# Patient Record
Sex: Female | Born: 1995 | Race: Black or African American | Hispanic: No | Marital: Single
Health system: Southern US, Community
[De-identification: ages and names within clinical notes are randomized; demographics above are authoritative.]

## PROBLEM LIST (undated history)

## (undated) DIAGNOSIS — R87629 Unspecified abnormal cytological findings in specimens from vagina: Secondary | ICD-10-CM

## (undated) DIAGNOSIS — Z789 Other specified health status: Secondary | ICD-10-CM

## (undated) HISTORY — DX: Unspecified abnormal cytological findings in specimens from vagina: R87.629

## (undated) HISTORY — PX: TONSILLECTOMY: SUR1361

---
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
## 2015-02-24 ENCOUNTER — Ambulatory Visit (INDEPENDENT_AMBULATORY_CARE_PROVIDER_SITE_OTHER): Payer: Medicaid Other | Admitting: Pediatrics

## 2015-02-24 ENCOUNTER — Encounter: Payer: Self-pay | Admitting: Pediatrics

## 2015-02-24 VITALS — BP 110/70 | Ht 70.75 in | Wt 146.4 lb

## 2015-02-24 DIAGNOSIS — Z113 Encounter for screening for infections with a predominantly sexual mode of transmission: Secondary | ICD-10-CM

## 2015-02-24 DIAGNOSIS — Z0000 Encounter for general adult medical examination without abnormal findings: Secondary | ICD-10-CM | POA: Diagnosis not present

## 2015-02-24 DIAGNOSIS — Z68.41 Body mass index (BMI) pediatric, 5th percentile to less than 85th percentile for age: Secondary | ICD-10-CM

## 2015-02-24 LAB — CBC WITH DIFFERENTIAL/PLATELET
BASOS ABS: 0 10*3/uL (ref 0.0–0.1)
BASOS PCT: 1 % (ref 0–1)
Eosinophils Absolute: 0 10*3/uL (ref 0.0–0.7)
Eosinophils Relative: 1 % (ref 0–5)
HEMATOCRIT: 39.4 % (ref 36.0–46.0)
HEMOGLOBIN: 12.5 g/dL (ref 12.0–15.0)
LYMPHS PCT: 39 % (ref 12–46)
Lymphs Abs: 1.4 10*3/uL (ref 0.7–4.0)
MCH: 28 pg (ref 26.0–34.0)
MCHC: 31.7 g/dL (ref 30.0–36.0)
MCV: 88.3 fL (ref 78.0–100.0)
MPV: 11.3 fL (ref 8.6–12.4)
Monocytes Absolute: 0.2 10*3/uL (ref 0.1–1.0)
Monocytes Relative: 5 % (ref 3–12)
NEUTROS ABS: 1.9 10*3/uL (ref 1.7–7.7)
Neutrophils Relative %: 54 % (ref 43–77)
Platelets: 206 10*3/uL (ref 150–400)
RBC: 4.46 MIL/uL (ref 3.87–5.11)
RDW: 14.2 % (ref 11.5–15.5)
WBC: 3.6 10*3/uL — AB (ref 4.0–10.5)

## 2015-02-24 NOTE — Patient Instructions (Signed)
Exercising to Stay Healthy Exercising regularly is important. It has many health benefits, such as:  Improving your overall fitness, flexibility, and endurance.  Increasing your bone density.  Helping with weight control.  Decreasing your body fat.  Increasing your muscle strength.  Reducing stress and tension.  Improving your overall health. In order to become healthy and stay healthy, it is recommended that you do moderate-intensity and vigorous-intensity exercise. You can tell that you are exercising at a moderate intensity if you have a higher heart rate and faster breathing, but you are still able to hold a conversation. You can tell that you are exercising at a vigorous intensity if you are breathing much harder and faster and cannot hold a conversation while exercising. HOW OFTEN SHOULD I EXERCISE? Choose an activity that you enjoy and set realistic goals. Your health care provider can help you to make an activity plan that works for you. Exercise regularly as directed by your health care provider. This may include:   Doing resistance training twice each week, such as:  Push-ups.  Sit-ups.  Lifting weights.  Using resistance bands.  Doing a given intensity of exercise for a given amount of time. Choose from these options:  150 minutes of moderate-intensity exercise every week.  75 minutes of vigorous-intensity exercise every week.  A mix of moderate-intensity and vigorous-intensity exercise every week. Children, pregnant women, people who are out of shape, people who are overweight, and older adults may need to consult a health care provider for individual recommendations. If you have any sort of medical condition, be sure to consult your health care provider before starting a new exercise program.  WHAT ARE SOME EXERCISE IDEAS? Some moderate-intensity exercise ideas include:   Walking at a rate of 1 mile in 15  minutes.  Biking.  Hiking.  Golfing.  Dancing. Some vigorous-intensity exercise ideas include:   Walking at a rate of at least 4.5 miles per hour.  Jogging or running at a rate of 5 miles per hour.  Biking at a rate of at least 10 miles per hour.  Lap swimming.  Roller-skating or in-line skating.  Cross-country skiing.  Vigorous competitive sports, such as football, basketball, and soccer.  Jumping rope.  Aerobic dancing. WHAT ARE SOME EVERYDAY ACTIVITIES THAT CAN HELP ME TO GET EXERCISE?  Yard work, such as:  Pushing a lawn mower.  Raking and bagging leaves.  Washing and waxing your car.  Pushing a stroller.  Shoveling snow.  Gardening.  Washing windows or floors. HOW CAN I BE MORE ACTIVE IN MY DAY-TO-DAY ACTIVITIES?  Use the stairs instead of the elevator.  Take a walk during your lunch break.  If you drive, park your car farther away from work or school.  If you take public transportation, get off one stop early and walk the rest of the way.  Make all of your phone calls while standing up and walking around.  Get up, stretch, and walk around every 30 minutes throughout the day. WHAT GUIDELINES SHOULD I FOLLOW WHILE EXERCISING?  Do not exercise so much that you hurt yourself, feel dizzy, or get very short of breath.  Consult your health care provider before starting a new exercise program.  Wear comfortable clothes and shoes with good support.  Drink plenty of water while you exercise to prevent dehydration or heat stroke. Body water is lost during exercise and must be replaced.  Work out until you breathe faster and your heart beats faster.   This information   is not intended to replace advice given to you by your health care provider. Make sure you discuss any questions you have with your health care provider.   Document Released: 01/27/2010 Document Revised: 01/15/2014 Document Reviewed: 05/28/2013 Elsevier Interactive Patient Education  2016 Elsevier Inc.  

## 2015-02-24 NOTE — Progress Notes (Signed)
Adolescent Well Care Visit Gabriela Jones is a 20 y.o. female who is here for well care.    PCP:  Maree Erie, MD   History was provided by the patient and mother.  Current Issues: Current concerns include she is doing well.   Nutrition: Nutrition/Eating Behaviors: eats a variety of healthful foods Adequate calcium in diet?: yes Supplements/ Vitamins: no  Exercise/ Media: Play any Sports?/ Exercise: sometimes goes to the mall and walks around; no other identified exercise Screen Time:  > 2 hours-counseling provided Media Rules or Monitoring?: no - adult patient  Sleep:  Sleep: sleeps   Social Screening: Lives with:  Her mother Parental relations:  good Activities, Work, and Regulatory affairs officer?: helps with some things around the house Concerns regarding behavior with peers?  Not at the present time. 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 Stressors of note: no  Education: School Name: She has graduated high school and is currently not in any academic or work-related programs. States she might go to [employer] and study early childhood development.  Menstruation:   Patient's last menstrual period was 02/24/2015. Menstrual History: menses are variable due to her hormonal implant for contraceptive management   Confidentiality was discussed with the patient and, if applicable, with caregiver as well. Patient's personal or confidential phone number: not provided  Tobacco?  no Secondhand smoke exposure?  no Drugs/ETOH?  no  Sexually Active?  yes - has a similar aged boyfriend  Pregnancy Prevention: nexplanon  Safe at home, in school & in relationships?  Yes Safe to self?  Yes   Screenings: Patient has a dental home: yes - Dr. William Dalton  The patient completed the Rapid Assessment for Adolescent Preventive Services screening questionnaire and the following topics were  identified as risk factors and discussed: healthy eating and exercise  In addition, the following topics were discussed as part of anticipatory guidance drug use, condom use, social isolation, screen time and personal independance.  PHQ-9 completed and results indicated no problems - score of ZERO  Physical Exam:  Filed Vitals:   02/24/15 1357  BP: 110/70  Height: 5' 10.75" (1.797 m)  Weight: 146 lb 6.4 oz (66.407 kg)   BP 110/70 mmHg  Ht 5' 10.75" (1.797 m)  Wt 146 lb 6.4 oz (66.407 kg)  BMI 20.56 kg/m2  LMP 02/24/2015 Body mass index: body mass index is 20.56 kg/(m^2). Blood pressure percentiles are 39% systolic and 64% diastolic based on 2000 NHANES data. Blood pressure percentile targets: 90: 126/80, 95: 130/84, 99 + 5 mmHg: 142/97.   Visual Acuity Screening   Right eye Left eye Both eyes  Without correction:  With correction:       General Appearance:   alert, oriented, no acute distress  HENT: Normocephalic, no obvious abnormality, conjunctiva clear  Mouth:   Normal appearing teeth, no obvious discoloration, dental caries, or dental caps  Neck:   Supple; thyroid: no enlargement, symmetric, no tenderness/mass/nodules  Chest Breast if female: 5  Lungs:   Clear to auscultation bilaterally, normal work of breathing  Heart:   Regular rate and rhythm, S1 and S2 normal, no murmurs;   Abdomen:   Soft, non-tender, no mass, or organomegaly  GU normal female external genitalia, pelvic not performed, normal breast exam without suspicious masses, self exam taught, Tanner stage 4  Musculoskeletal:   Tone and strength strong and symmetrical, all  extremities               Lymphatic:   No cervical adenopathy  Skin/Hair/Nails:   Skin warm, dry and intact, no rashes, no bruises or petechiae  Neurologic:   Strength, gait, and coordination normal and age-appropriate     Assessment and Plan:   1. Encounter for general adult medical examination without abnormal findings    2. Routine screening for STI (sexually transmitted infection)   3. Body mass index, pediatric, 5th percentile to less than 85th percentile for age     BMI is appropriate for age  Hearing screening result:normal Vision screening result: normal  Orders Placed This Encounter  Procedures  . GC/Chlamydia Probe Amp  . Lipid panel  . HIV antibody  . CBC with Differential   Encouraged Jo-Ann to consider volunteer work with a summer kids program or [employer] to learn more about working with children and see if this is suited to her. Advised she join in with home activities to increase independent living skills.   Return for annual wellness visit and prn acute care.  Maree Erie, MD

## 2015-02-25 ENCOUNTER — Telehealth: Payer: Self-pay | Admitting: Pediatrics

## 2015-02-25 LAB — LIPID PANEL
Cholesterol: 177 mg/dL — ABNORMAL HIGH (ref 125–170)
HDL: 45 mg/dL (ref 36–76)
LDL Cholesterol: 123 mg/dL — ABNORMAL HIGH (ref <110)
TRIGLYCERIDES: 47 mg/dL (ref 40–136)
Total CHOL/HDL Ratio: 3.9 Ratio (ref <=5.0)
VLDL: 9 mg/dL (ref <30)

## 2015-02-25 LAB — GC/CHLAMYDIA PROBE AMP
CT PROBE, AMP APTIMA: NOT DETECTED
GC Probe RNA: NOT DETECTED

## 2015-02-25 LAB — HIV ANTIBODY (ROUTINE TESTING W REFLEX): HIV: NONREACTIVE

## 2015-02-25 NOTE — Telephone Encounter (Signed)
Called and reached mother (Amos's preferred contact). Informed her that cholesterol is up a little and encouraged they avoid excessive intake of fatty and fried foods, increase exercise. Mom voiced understanding and stated she will "get her under control", speaking in a jovial and appropriate manner. Advised mom we will recheck next year and she can call as needed.

## 2015-04-30 ENCOUNTER — Other Ambulatory Visit: Payer: Self-pay | Admitting: Pediatrics

## 2015-05-25 ENCOUNTER — Telehealth: Payer: Self-pay | Admitting: *Deleted

## 2015-05-25 NOTE — Telephone Encounter (Signed)
Called back at listed cell number and reached voice mail; left message that I called and she can call back.

## 2015-05-25 NOTE — Telephone Encounter (Signed)
Caller requested a call back from Dr. Duffy RhodyStanley. Left no further information.

## 2015-05-26 NOTE — Telephone Encounter (Signed)
Pt called back this morning stating that she missed a call from Dr. Dorothyann Peng. Pt stated that she needs tot alk with dr. Dorothyann Peng and asked to be called at [phone]

## 2015-05-27 NOTE — Telephone Encounter (Signed)
Reached voice mail, again, and left message. Left message stating my typical time to call back is after finishing patients in the evening; however, is she has an emergency question any medical staff member here may be able to help. Stated I am off for the weekend and will try back on Monday.

## 2015-06-02 ENCOUNTER — Telehealth: Payer: Self-pay | Admitting: *Deleted

## 2015-06-02 NOTE — Telephone Encounter (Signed)
Called and left a voicemail asking patient to return call and ask for me so I can assist her or give Dr. Duffy RhodyStanley information so we can help her.

## 2015-06-15 ENCOUNTER — Ambulatory Visit (INDEPENDENT_AMBULATORY_CARE_PROVIDER_SITE_OTHER): Payer: Medicaid Other | Admitting: Pediatrics

## 2015-06-15 ENCOUNTER — Encounter: Payer: Self-pay | Admitting: Pediatrics

## 2015-06-15 VITALS — Wt 145.2 lb

## 2015-06-15 DIAGNOSIS — M62830 Muscle spasm of back: Secondary | ICD-10-CM

## 2015-06-15 MED ORDER — NAPROXEN 375 MG PO TABS: ORAL_TABLET | ORAL | Status: DC

## 2015-06-15 MED ORDER — CYCLOBENZAPRINE HCL 5 MG PO TABS: ORAL_TABLET | ORAL | Status: DC

## 2015-06-15 NOTE — Patient Instructions (Signed)
Avoid wearing flip flops, flat sandals or ballet style shoes for any distance walking; they do not supply adequate support and may add to back strain.   Back Pain, Adult Back pain is very common. The pain often gets better over time. The cause of back pain is usually not dangerous. Most people can learn to manage their back pain on their own.  HOME CARE  Watch your back pain for any changes. The following actions may help to lessen any pain you are feeling:  Stay active. Start with short walks on flat ground if you can. Try to walk farther each day.  Exercise regularly as told by your doctor. Exercise helps your back heal faster. It also helps avoid future injury by keeping your muscles strong and flexible.  Do not sit, drive, or stand in one place for more than 30 minutes.  Do not stay in bed. Resting more than 1-2 days can slow down your recovery.  Be careful when you bend or lift an object. Use good form when lifting:  Bend at your knees.  Keep the object close to your body.  Do not twist.  Sleep on a firm mattress. Lie on your side, and bend your knees. If you lie on your back, put a pillow under your knees.  Take medicines only as told by your doctor.  Put ice on the injured area.  Put ice in a plastic bag.  Place a towel between your skin and the bag.  Leave the ice on for 20 minutes, 2-3 times a day for the first 2-3 days. After that, you can switch between ice and heat packs.  Avoid feeling anxious or stressed. Find good ways to deal with stress, such as exercise.  Maintain a healthy weight. Extra weight puts stress on your back. GET HELP IF:   You have pain that does not go away with rest or medicine.  You have worsening pain that goes down into your legs or buttocks.  You have pain that does not get better in one week.  You have pain at night.  You lose weight.  You have a fever or chills. GET HELP RIGHT AWAY IF:   You cannot control when you poop  (bowel movement) or pee (urinate).  Your arms or legs feel weak.  Your arms or legs lose feeling (numbness).  You feel sick to your stomach (nauseous) or throw up (vomit).  You have belly (abdominal) pain.  You feel like you may pass out (faint).   This information is not intended to replace advice given to you by your health care provider. Make sure you discuss any questions you have with your health care provider.   Document Released: 06/13/2007 Document Revised: 01/15/2014 Document Reviewed: 04/28/2013 Elsevier Interactive Patient Education 2016 Elsevier

## 2015-06-15 NOTE — Progress Notes (Signed)
Subjective:     Patient ID: Gabriela Jones, female   DOB: 09/27/1995, 20 y.o.   MRN: [id number]  HPI Gabriela Jones is here with concerns of recurrent back pain for 1 week. She is accompanied by her mother who remains in the waiting room. [name] states this is like the pain she had before. States she wasn't doing anything special but developed the lower back pain; trigger is not known. Taking ibuprofen or tylenol without help. No fever or abdominal pain. She has been walking for fun and exercise. Not working or driving.  PMH, problem list, medications and allergies, family and social history reviewed and updated as indicated. Chart review reveals presentation to ED 8 months ago with muscle spasm of the back treated with Flexeril and ibuprofen.  Review of Systems  Constitutional: Positive for activity change. Negative for fever and appetite change.  HENT: Negative for congestion.   Respiratory: Negative for cough.   Gastrointestinal: Negative for abdominal distention.  Genitourinary: Negative for difficulty urinating.  Musculoskeletal: Positive for myalgias and back pain. Negative for neck pain and neck stiffness.       Objective:   Physical Exam  Constitutional: She appears well-developed and well-nourished. No distress.  She is wearing basic flat flip-flops  Cardiovascular: Normal rate and normal heart sounds.   No murmur heard. Pulmonary/Chest: Effort normal and breath sounds normal. No respiratory distress.  Abdominal: Soft. She exhibits no distension. There is no tenderness.  Musculoskeletal:  Standing: Patient's posture has obvious tilt to the right with right shoulder lower than left. Muscle prominence in left lumbar area with pain on percussion left more than right. Supine: pain on flexion of thigh at hip bilaterally but more discomfort voiced on the left  Skin: Skin is warm and dry.  Nursing note and vitals reviewed.      Assessment:     1. Muscle spasm of back    Unsure of  trigger but may be related to activity in poorly supportive shoes    Plan:     Meds ordered this encounter  Medications  . naproxen (NAPROSYN) 375 MG tablet    Sig: Take one tablet by mouth every 12 hours as needed, with food, for relief of back pain    Dispense:  20 tablet    Refill:  0  . cyclobenzaprine (FLEXERIL) 5 MG tablet    Sig: Take one tablet by mouth at bedtime for relief of muscle spasm    Dispense:  3 tablet    Refill:  0  Counseled on the Flexeril with information about probable sedation. Advised to stay home where mom can observe her reaction to the medication and avoid going out with friends or others who may place her at risk. No alcohol. Discussed use of naproxen.  Rest.  Proper shoes for activities discussed. Recheck in office next week and prn.  Will consider PT if not improving. Both patient and mother voiced understanding and ability to follow through.  Greater than 50% of this 25 minute face to face encounter spent in counseling for presenting issues.  Maree ErieStanley, Chiana Wamser J, MD

## 2015-06-17 ENCOUNTER — Ambulatory Visit: Payer: Self-pay | Admitting: Pediatrics

## 2015-06-23 ENCOUNTER — Encounter: Payer: Self-pay | Admitting: Pediatrics

## 2015-06-23 ENCOUNTER — Ambulatory Visit (INDEPENDENT_AMBULATORY_CARE_PROVIDER_SITE_OTHER): Payer: Medicaid Other | Admitting: Pediatrics

## 2015-06-23 VITALS — BP 108/50 | Ht 70.5 in | Wt 158.6 lb

## 2015-06-23 DIAGNOSIS — M62830 Muscle spasm of back: Secondary | ICD-10-CM

## 2015-06-23 NOTE — Patient Instructions (Addendum)
Continue the Naproxen for pain relief as needed. Avoid wearing flip flops. Start the stretches noted below, gradually. Once the pain is gone, get into a daily exercise plan like walking (wear supportive sneakers), swimming, biking, dance, yoga. Yoga is especially good for some one who has recurring back pain because of the gently stretch to the muscles.  Back Exercises If you have pain in your back, do these exercises 2-3 times each day or as told by your doctor. When the pain goes away, do the exercises once each day, but repeat the steps more times for each exercise (do more repetitions). If you do not have pain in your back, do these exercises once each day or as told by your doctor. EXERCISES Single Knee to Chest Do these steps 3-5 times in a row for each leg: 1. Lie on your back on a firm bed or the floor with your legs stretched out. 2. Bring one knee to your chest. 3. Hold your knee to your chest by grabbing your knee or thigh. 4. Pull on your knee until you feel a gentle stretch in your lower back. 5. Keep doing the stretch for 10-30 seconds. 6. Slowly let go of your leg and straighten it. Pelvic Tilt Do these steps 5-10 times in a row: 1. Lie on your back on a firm bed or the floor with your legs stretched out. 2. Bend your knees so they point up to the ceiling. Your feet should be flat on the floor. 3. Tighten your lower belly (abdomen) muscles to press your lower back against the floor. This will make your tailbone point up to the ceiling instead of pointing down to your feet or the floor. 4. Stay in this position for 5-10 seconds while you gently tighten your muscles and breathe evenly. Cat-Cow Do these steps until your lower back bends more easily: 1. Get on your hands and knees on a firm surface. Keep your hands under your shoulders, and keep your knees under your hips. You may put padding under your knees. 2. Let your head hang down, and make your tailbone point down to the  floor so your lower back is round like the back of a cat. 3. Stay in this position for 5 seconds. 4. Slowly lift your head and make your tailbone point up to the ceiling so your back hangs low (sags) like the back of a cow. 5. Stay in this position for 5 seconds. Press-Ups Do these steps 5-10 times in a row: 1. Lie on your belly (face-down) on the floor. 2. Place your hands near your head, about shoulder-width apart. 3. While you keep your back relaxed and keep your hips on the floor, slowly straighten your arms to raise the top half of your body and lift your shoulders. Do not use your back muscles. To make yourself more comfortable, you may change where you place your hands. 4. Stay in this position for 5 seconds. 5. Slowly return to lying flat on the floor. Bridges Do these steps 10 times in a row: 1. Lie on your back on a firm surface. 2. Bend your knees so they point up to the ceiling. Your feet should be flat on the floor. 3. Tighten your butt muscles and lift your butt off of the floor until your waist is almost as high as your knees. If you do not feel the muscles working in your butt and the back of your thighs, slide your feet 1-2 inches farther away from  your butt. 4. Stay in this position for 3-5 seconds. 5. Slowly lower your butt to the floor, and let your butt muscles relax. If this exercise is too easy, try doing it with your arms crossed over your chest. Belly Crunches Do these steps 5-10 times in a row: 1. Lie on your back on a firm bed or the floor with your legs stretched out. 2. Bend your knees so they point up to the ceiling. Your feet should be flat on the floor. 3. Cross your arms over your chest. 4. Tip your chin a little bit toward your chest but do not bend your neck. 5. Tighten your belly muscles and slowly raise your chest just enough to lift your shoulder blades a tiny bit off of the floor. 6. Slowly lower your chest and your head to the floor. Back Lifts Do  these steps 5-10 times in a row: 1. Lie on your belly (face-down) with your arms at your sides, and rest your forehead on the floor. 2. Tighten the muscles in your legs and your butt. 3. Slowly lift your chest off of the floor while you keep your hips on the floor. Keep the back of your head in line with the curve in your back. Look at the floor while you do this. 4. Stay in this position for 3-5 seconds. 5. Slowly lower your chest and your face to the floor. GET HELP IF:  Your back pain gets a lot worse when you do an exercise.  Your back pain does not lessen 2 hours after you exercise. If you have any of these problems, stop doing the exercises. Do not do them again unless your doctor says it is okay. GET HELP RIGHT AWAY IF:  You have sudden, very bad back pain. If this happens, stop doing the exercises. Do not do them again unless your doctor says it is okay.   This information is not intended to replace advice given to you by your health care provider. Make sure you discuss any questions you have with your health care provider.   Document Released: 01/27/2010 Document Revised: 09/15/2014 Document Reviewed: 02/18/2014 Elsevier Interactive Patient Education [employer]2016 Elsevier Inc.

## 2015-06-27 ENCOUNTER — Encounter: Payer: Self-pay | Admitting: Pediatrics

## 2015-06-27 NOTE — Progress Notes (Signed)
Subjective:     Patient ID: Gabriela Jones, female   DOB: 05/22/1995, 20 y.o.   MRN: [id number]  HPI Gabriela Jones is here today to follow up on back pain. She states her mother remains in the waiting room. Gabriela Jones was seen in the office one week ago and given a short course of muscle relaxant for use at bedtime and naproxen for pain relief. She states she is better but not totally back to normal. States she is not wearing flip-flops much and no excessive activity or lifting.  No new concerns. PMH, problem list, medications and allergies, family and social history reviewed and updated as indicated.  Review of Systems  Constitutional: Negative for fever, activity change, appetite change and fatigue.  Gastrointestinal: Negative for abdominal pain and constipation.  Musculoskeletal: Positive for back pain. Negative for joint swelling, arthralgias, neck pain and neck stiffness.  Psychiatric/Behavioral: Negative for sleep disturbance.       Objective:   Physical Exam  Constitutional: She is oriented to person, place, and time. She appears well-developed and well-nourished. No distress.  Wearing Zori sandal type flip-flops  Cardiovascular: Normal rate and normal heart sounds.   No murmur heard. Pulmonary/Chest: Effort normal and breath sounds normal.  Musculoskeletal:  Normal gait; full forward flexion of spine with minimal noted muscle asymmetry with spasm in the right lumbar area. No redness or pain on palpation/percussion  Neurological: She is alert and oriented to person, place, and time. Coordination normal.  Skin: Skin is warm and dry.  Nursing note and vitals reviewed.      Assessment:     1. Muscle spasm of back   Improved clinically and by report.     Plan:     Advised on no significant walking in flip-flops or non-supportive shoes. Encouraged exercise to help with muscle relaxation; provided documentation with pictures and showed basics of performance. Okay to continue naproxen prn;  call if increased symptoms or if not better in one more week.  Greater than 50% of this 15 minute face to face encounter spent in counseling for presenting issues.  Maree ErieStanley, Angela J, MD

## 2015-07-04 ENCOUNTER — Emergency Department (HOSPITAL_COMMUNITY)
Admission: EM | Admit: 2015-07-04 | Discharge: 2015-07-04 | Disposition: A | Discharge status: Home / Self Care | Payer: Medicaid Other | Attending: Emergency Medicine | Admitting: Emergency Medicine

## 2015-07-04 ENCOUNTER — Encounter (HOSPITAL_COMMUNITY): Payer: Self-pay | Admitting: Nurse Practitioner

## 2015-07-04 DIAGNOSIS — Z7722 Contact with and (suspected) exposure to environmental tobacco smoke (acute) (chronic): Secondary | ICD-10-CM | POA: Insufficient documentation

## 2015-07-04 DIAGNOSIS — K5900 Constipation, unspecified: Secondary | ICD-10-CM | POA: Diagnosis present

## 2015-07-04 MED ORDER — MAGNESIUM CITRATE PO SOLN
1.0000 | Freq: Once | ORAL | Status: AC
  Administered 2015-07-04: 1 via ORAL
  Filled 2015-07-04: qty 296

## 2015-07-04 NOTE — ED Notes (Addendum)
She c/o constipation since waking this morning. She reports feeling the need to have a BM all day but she can not push her stool out and it feels very hard. She did not try anything at home for relief. She denies any abd pain, n/v, urinary changes.

## 2015-07-04 NOTE — ED Provider Notes (Signed)
CSN: [id number]     Arrival date & time 07/04/15  1823 History  By signing my name below, I, Rosario AdieWilliam Andrew Hiatt, attest that this documentation has been prepared under the direction and in the presence of [facility] Center[facility]ime Ward, PA-C.   Electronically Signed: Rosario AdieWilliam Andrew Hiatt, ED Scribe. 07/04/2015. 11:07 PM.   Chief Complaint  Patient presents with  . Constipation   The history is provided by the patient. No language interpreter was used.   HPI Comments: Gabriela Jones is a 20 y.o. female who presents to the Emergency Department complaining of gradual onset, unchanged, constant constipation onset approximately 12 hours PTA. She states that she has felt the need to have a bowel movement since the onset of her symptoms. Her last normal bowel movement was one day PTA, and did not contain blood. She states that she did have a bowel movement just PTA but very little would come out. No OTC medications or home remedies tried PTA. Pt denies nausea, vomiting, abdominal pain, back pain, or fever.  History reviewed. No pertinent past medical history. Past Surgical History  Procedure Laterality Date  . Tonsillectomy     Family History  Problem Relation Age of Onset  . Hypertension Father    Social History  Substance Use Topics  . Smoking status: Passive Smoke Exposure - Never Smoker  . Smokeless tobacco: Never Used  . Alcohol Use: No   OB History    No data available     Review of Systems  Constitutional: Negative for fever.  Gastrointestinal: Positive for constipation. Negative for nausea, vomiting and abdominal pain.  Musculoskeletal: Negative for back pain.   Allergies  Review of patient's allergies indicates no known allergies.  Home Medications   Prior to Admission medications   Medication Sig Start Date End Date Taking? Authorizing Provider  cyclobenzaprine (FLEXERIL) 5 MG tablet Take one tablet by mouth at bedtime for relief of muscle spasm Patient not taking: Reported on 07/04/2015  06/15/15   Maree ErieAngela J Stanley, MD  DIFFERIN 0.1 % cream APPLY TOPICALLY AT BEDTIME TO AFFECTED AREAS OF ACNE AFTER CLEANSING FACE Patient not taking: Reported on 07/04/2015 04/30/15   Maree ErieAngela J Stanley, MD  naproxen (NAPROSYN) 375 MG tablet Take one tablet by mouth every 12 hours as needed, with food, for relief of back pain Patient not taking: Reported on 07/04/2015 06/15/15   Maree ErieAngela J Stanley, MD  polyethylene glycol powder (GLYCOLAX/MIRALAX) powder Mix one capful in 8 ounces of liquid and drink daily for relief of constipation Patient not taking: Reported on 07/04/2015 02/18/14   Maree ErieAngela J Stanley, MD   BP 95/58 mmHg  Pulse 86  Temp(Src) 98.9 F (37.2 C) (Oral)  Resp 17  SpO2 100%  LMP 05/23/2015 (Within Days)   Physical Exam  Constitutional: She is oriented to person, place, and time. She appears well-developed and well-nourished.  HENT:  Head: Normocephalic and atraumatic.  Cardiovascular: Normal rate, regular rhythm and normal heart sounds.   Pulmonary/Chest: Effort normal and breath sounds normal. No respiratory distress.  Abdominal: Soft. Bowel sounds are normal. She exhibits no distension and no mass. There is tenderness. There is no rebound and no guarding.  Mild left sided tenderness.  Musculoskeletal: Normal range of motion.  Neurological: She is alert and oriented to person, place, and time.  Skin: Skin is warm and dry.  Psychiatric: She has a normal mood and affect. Her behavior is normal.  Nursing note and vitals reviewed.  ED Course  Procedures (including critical care  time)  DIAGNOSTIC STUDIES: Oxygen Saturation is 100% on RA, normal by my interpretation.   COORDINATION OF CARE: 11:05 PM-Discussed next steps with pt including Magnesium Citrate. Pt verbalized understanding and is agreeable with the plan.   MDM   Final diagnoses:  Constipation, unspecified constipation type   Gabriela Jones presents to ED for constipation. Denies abdominal pain. Benign abdominal exam.  Otherwise healthy, well appearing 20 y.o. female who is afebrile and hemodynamically stable. Mag citrate given in ED. Home care instructions including hydration, diet, and miralax regimen discussed. PCP follow up recommended. Abdominal pain return precautions given. All questions answered.   I personally performed the services described in this documentation, which was scribed in my presence. The recorded information has been reviewed and is accurate.    [facility] CentersJaime Pilcher Ward, PA-C 07/05/15 [id number]  Melene Planan Floyd, DO 07/05/15 1513

## 2015-07-04 NOTE — Discharge Instructions (Signed)
One cap of Miralax mixed in a glass of water daily until bowel movements are regular. An over the counter stool softener such as Colace can also be used.  Follow up with your primary physician in regard's to today's visit. Return to ER for vomiting, new or worsening symptoms, any additional concerns.   GETTING TO GOOD BOWEL HEALTH.     The goal: ONE SOFT BOWEL MOVEMENT A DAY!  To have soft, regular bowel movements:   Drink at least 8 tall glasses of water a day.    Take plenty of fiber.  Fiber is the undigested part of plant food that passes into the colon, acting s natures broom to encourage bowel motility and movement.  Fiber can absorb and hold large amounts of water. This results in a larger, bulkier stool, which is soft and easier to pass. Work gradually over several weeks up to 6 servings a day of fiber (25g a day even more if needed) in the form of: o Vegetables -- Root (potatoes, carrots, turnips), leafy green (lettuce, salad greens, celery, spinach), or cooked high residue (cabbage, broccoli, etc) o Fruit -- Fresh (unpeeled skin & pulp), Dried (prunes, apricots, cherries, etc ),  or stewed ( applesauce)  o Whole grain breads, pasta, etc (whole wheat)  o Bran cereals   No reading or other relaxing activity while on the toilet. If bowel movements take longer than 5 minutes, you are too constipated

## 2015-07-04 NOTE — ED Notes (Signed)
PA at bedside.

## 2015-10-03 ENCOUNTER — Encounter (HOSPITAL_COMMUNITY): Payer: Self-pay | Admitting: Emergency Medicine

## 2015-10-03 ENCOUNTER — Emergency Department (HOSPITAL_COMMUNITY)
Admission: EM | Admit: 2015-10-03 | Discharge: 2015-10-03 | Disposition: A | Discharge status: Home / Self Care | Payer: Medicaid Other | Attending: Dermatology | Admitting: Dermatology

## 2015-10-03 ENCOUNTER — Other Ambulatory Visit: Payer: Self-pay | Admitting: Pediatrics

## 2015-10-03 DIAGNOSIS — Z7722 Contact with and (suspected) exposure to environmental tobacco smoke (acute) (chronic): Secondary | ICD-10-CM | POA: Diagnosis not present

## 2015-10-03 DIAGNOSIS — Z5321 Procedure and treatment not carried out due to patient leaving prior to being seen by health care provider: Secondary | ICD-10-CM | POA: Diagnosis not present

## 2015-10-03 DIAGNOSIS — Y939 Activity, unspecified: Secondary | ICD-10-CM | POA: Diagnosis not present

## 2015-10-03 DIAGNOSIS — S6991XA Unspecified injury of right wrist, hand and finger(s), initial encounter: Secondary | ICD-10-CM | POA: Insufficient documentation

## 2015-10-03 DIAGNOSIS — Y929 Unspecified place or not applicable: Secondary | ICD-10-CM | POA: Diagnosis not present

## 2015-10-03 DIAGNOSIS — K5901 Slow transit constipation: Secondary | ICD-10-CM

## 2015-10-03 DIAGNOSIS — X58XXXA Exposure to other specified factors, initial encounter: Secondary | ICD-10-CM | POA: Insufficient documentation

## 2015-10-03 DIAGNOSIS — Z79899 Other long term (current) drug therapy: Secondary | ICD-10-CM | POA: Insufficient documentation

## 2015-10-03 DIAGNOSIS — Y999 Unspecified external cause status: Secondary | ICD-10-CM | POA: Diagnosis not present

## 2015-10-03 NOTE — ED Triage Notes (Signed)
Rt index finger hurting x 3 days  Denies any injury

## 2015-10-03 NOTE — ED Notes (Signed)
NT called pt's name to be room x3. No answer.

## 2015-10-03 NOTE — ED Notes (Signed)
Patient called in sub waiting and main waiting room with no answer.

## 2016-02-06 ENCOUNTER — Other Ambulatory Visit: Payer: Self-pay | Admitting: Pediatrics

## 2016-02-06 DIAGNOSIS — K5901 Slow transit constipation: Secondary | ICD-10-CM

## 2016-02-27 ENCOUNTER — Ambulatory Visit: Payer: Medicaid Other | Admitting: Pediatrics

## 2016-03-30 ENCOUNTER — Encounter: Payer: Self-pay | Admitting: Pediatrics

## 2016-03-30 ENCOUNTER — Other Ambulatory Visit: Payer: Self-pay | Admitting: Pediatrics

## 2016-03-30 ENCOUNTER — Ambulatory Visit (INDEPENDENT_AMBULATORY_CARE_PROVIDER_SITE_OTHER): Payer: Medicaid Other | Admitting: Pediatrics

## 2016-03-30 VITALS — BP 112/70 | Ht 70.5 in | Wt 182.2 lb

## 2016-03-30 DIAGNOSIS — Z68.41 Body mass index (BMI) pediatric, 5th percentile to less than 85th percentile for age: Secondary | ICD-10-CM | POA: Diagnosis not present

## 2016-03-30 DIAGNOSIS — Z113 Encounter for screening for infections with a predominantly sexual mode of transmission: Secondary | ICD-10-CM

## 2016-03-30 DIAGNOSIS — Z0001 Encounter for general adult medical examination with abnormal findings: Secondary | ICD-10-CM | POA: Diagnosis not present

## 2016-03-30 DIAGNOSIS — R51 Headache: Secondary | ICD-10-CM | POA: Diagnosis not present

## 2016-03-30 DIAGNOSIS — R519 Headache, unspecified: Secondary | ICD-10-CM

## 2016-03-30 DIAGNOSIS — L700 Acne vulgaris: Secondary | ICD-10-CM | POA: Insufficient documentation

## 2016-03-30 LAB — CBC
HCT: 41.7 % (ref 35.0–45.0)
Hemoglobin: 13.4 g/dL (ref 11.7–15.5)
MCH: 28.3 pg (ref 27.0–33.0)
MCHC: 32.1 g/dL (ref 32.0–36.0)
MCV: 88 fL (ref 80.0–100.0)
MPV: 10.9 fL (ref 7.5–12.5)
Platelets: 212 10*3/uL (ref 140–400)
RBC: 4.74 MIL/uL (ref 3.80–5.10)
RDW: 13.4 % (ref 11.0–15.0)
WBC: 4.1 10*3/uL (ref 3.8–10.8)

## 2016-03-30 LAB — LIPID PANEL
Cholesterol: 183 mg/dL (ref <200)
HDL: 36 mg/dL — ABNORMAL LOW (ref >50)
LDL Cholesterol: 124 mg/dL — ABNORMAL HIGH (ref <100)
Total CHOL/HDL Ratio: 5.1 Ratio — ABNORMAL HIGH (ref <5.0)
Triglycerides: 114 mg/dL (ref <150)
VLDL: 23 mg/dL (ref <30)

## 2016-03-30 MED ORDER — DIFFERIN 0.1 % EX CREA: TOPICAL_CREAM | CUTANEOUS | 2 refills | Status: DC

## 2016-03-30 NOTE — Patient Instructions (Addendum)
Please connect with an adult provider of your choice and let us know if you need our clinic to provide records to the new clinic.   Analgesic Rebound Headache An analgesic rebound headache, sometimes called a medication overuse headache, is a headache that comes after pain medicine (analgesic) taken to treat the original (primary) headache has worn off. Any type of primary headache can return as a rebound headache if a person regularly takes analgesics more than three times a week to treat it. The types of primary headaches that are commonly associated with rebound headaches include:  Migraines.  Headaches that arise from tense muscles in the head and neck area (tension headaches).  Headaches that develop and happen again (recur) on one side of the head and around the eye (cluster headaches). If rebound headaches continue, they become chronic daily headaches. What are the causes? This condition may be caused by frequent use of:  Over-the-counter medicines such as aspirin, ibuprofen, and acetaminophen.  Sinus relief medicines and other medicines that contain caffeine.  Narcotic pain medicines such as codeine and oxycodone. What are the signs or symptoms? The symptoms of a rebound headache are the same as the symptoms of the original headache. Some of the symptoms of specific types of headaches include: Migraine headache   Pulsing or throbbing pain on one or both sides of the head.  Severe pain that interferes with daily activities.  Pain that is worsened by physical activity.  Nausea, vomiting, or both.  Pain with exposure to bright light, loud noises, or strong smells.  General sensitivity to bright light, loud noises, or strong smells.  Visual changes.  Numbness of one or both arms. Tension headache   Pressure around the head.  Dull, aching head pain.  Pain felt over the front and sides of the head.  Tenderness in the muscles of the head, neck, and shoulders. Cluster  headache   Severe pain that begins in or around one eye or temple.  Redness and tearing in the eye on the same side as the pain.  Droopy or swollen eyelid.  One-sided head pain.  Nausea.  Runny nose.  Sweaty, pale facial skin.  Restlessness. How is this diagnosed? This condition is diagnosed by:  Reviewing your medical history. This includes the nature of your primary headaches.  Reviewing the types of pain medicines that you have been using to treat your headaches and how often you take them. How is this treated? This condition may be treated or managed by:  Discontinuing frequent use of the analgesic medicine. Doing this may worsen your headaches at first, but the pain should eventually become more manageable, less frequent, and less severe.  Seeing a headache specialist. He or she may be able to help you manage your headaches and help make sure there is not another cause of the headaches.  Using methods of stress relief, such as acupuncture, counseling, biofeedback, and massage. Talk with your health care provider about which methods might be good for you. Follow these instructions at home:  Take over-the-counter and prescription medicines only as told by your health care provider.  Stop the repeated use of pain medicine as told by your health care provider. Stopping can be difficult. Carefully follow instructions from your health care provider.  Avoid triggers that are known to cause your primary headaches.  Keep all follow-up visits as told by your health care provider. This is important. Contact a health care provider if:  You continue to experience headaches after following  treatments that your health care provider recommended. Get help right away if:  You develop new headache pain.  You develop headache pain that is different than what you have experienced in the past.  You develop numbness or tingling in your arms or legs.  You develop changes in your  speech or vision. This information is not intended to replace advice given to you by your health care provider. Make sure you discuss any questions you have with your health care provider. Document Released: 03/17/2003 Document Revised: 07/15/2015 Document Reviewed: 05/30/2015 Elsevier Interactive Patient Education  2017 [employer]

## 2016-03-30 NOTE — Progress Notes (Signed)
Adolescent Well Care Visit Avalyn A Jones is a 21 y.o. female who is here for well care.    PCP:  Maree Erie, MD   History was provided by the patient.  Current Issues: Current concerns include   1) Cough - Patient has had a productive cough 2 weeks ago. It is productive of brown liquid. Mom smokes frequently inside their home. She had URI symptoms with the cough but they have largely resolved. Pertinent negatives include no: fever, rhinorrhea, congestion, myaglias, SOB or wheeze, vomiting or diarrhea  No sick contacts, eating and drinking well, peeing a normal amount  Past Problems 1) Constipation - ED visit 6/26. She has not needed any medication or had any symptoms of constipation (1 soft stool daily) 2) Back pain - was given short course of muscle relaxants then naproxen. It continues to be a problem intermittently when she tries to move her back a lot or stand for long periods of time for which she takes Tylenol or Motrin PM. This relieves the pain 3) Headaches - has headaches almost daily. Pain is most in forehead and temple. Pertinent negatives, no: nausea, photophobia, phonophobia, neck stiffness or pain.  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  Menstrual History: Menarche age [age]. Some periods are 2 weeks off and on, not heavy the  whole time (gets to be more like spotting but lasts for long). Cramps are maneageable. Is on Depo Provera, has been on that for.    Confidentiality was discussed with the patient and, if applicable, with caregiver as well. Patient's personal or confidential phone number:   Tobacco?  no Secondhand smoke exposure?  yes, knows people who smoke around her Drugs/ETOH?  no  Sexually Active?  yes   Pregnancy Prevention: Depo Provera and condoms 100% of the time  Safe at home, in school & in relationships?  Yes Safe to self?  Yes   Screenings: Patient has a dental home: yes  The patient completed the Rapid Assessment for Adolescent Preventive Services screening questionnaire and the following topics were identified as risk factors and discussed: healthy eating, tobacco use, condom use and birth control  In addition, the following topics were discussed as part of anticipatory guidance healthy eating, exercise, condom use and birth control.  PHQ-9 completed and results indicated no depression  Physical Exam:  Vitals:   03/30/16 1002  BP: 112/70  Weight: 182 lb 3.2 oz (82.6 kg)  Height: 5' 10.5" (1.791 m)   BP 112/70   Ht 5' 10.5" (1.791 m)   Wt 182 lb 3.2 oz (82.6 kg)   BMI 25.77 kg/m  Body mass index: body mass index is 25.77 kg/m. Growth percentile SmartLinks can only be used for patients less than 67 years old.   Hearing Screening   Method: Audiometry   125Hz  250Hz  500Hz  1000Hz  2000Hz  3000Hz  4000Hz  6000Hz  8000Hz   Right ear:  20 20 20  20     Left ear:   20 20 20  20       Visual Acuity Screening   Right eye Left eye Both eyes  Without correction: 20/20 20/20 20/20   With correction:       General Appearance:   alert, oriented, no acute distress  HENT: Normocephalic, no obvious abnormality, conjunctiva clear  Mouth:   Normal appearing teeth, no obvious discoloration, dental caries, or dental caps  Neck:   Supple; thyroid: no enlargement, symmetric, no  tenderness/mass/nodules  Chest Breast if female: 5  Lungs:   Clear to auscultation bilaterally, normal work of breathing  Heart:   Regular rate and rhythm, S1 and S2 normal, no murmurs;   Abdomen:   Soft, non-tender, no mass, or organomegaly  GU genitalia not examined  Musculoskeletal:   Tone and strength strong and symmetrical, all extremities               Lymphatic:   No cervical adenopathy  Skin/Hair/Nails:   Skin warm, dry and intact, no rashes, no bruises or petechiae  Neurologic:   Strength, gait, and coordination normal and age-appropriate   Assessment and Plan:   Preventive Care - order CBC - order Lipid panel - order GC/chlamydia  Headaches - patient with almost daily frontal headache - Recommended stopping analgesics daily and counseled regarding rebound headaches  Cough - patient's symptoms and exam (normal lung exam) most consistent with resolving viral URI - Recommended symptomatic management with honey - Counseled that family should return if cough continues more than 2 weeks past end of illness or seems to be prompted by things other than illness  BMI is appropriate for age  Hearing screening result:normal Vision screening result: normal  Counseling provided for all of the tests  Orders Placed This Encounter  Procedures  . GC/Chlamydia Probe Amp     No Follow-up on file.. Patient was counseled on need to find an adult PCP as she is aging out of our practice.  Dorene SorrowAnne Dana Debo, MD

## 2016-03-31 LAB — GC/CHLAMYDIA PROBE AMP
CT PROBE, AMP APTIMA: NOT DETECTED
GC PROBE AMP APTIMA: NOT DETECTED

## 2016-04-03 ENCOUNTER — Telehealth: Payer: Self-pay | Admitting: *Deleted

## 2016-04-03 NOTE — Telephone Encounter (Signed)
Patient calling for results from testing. Told her PCP would need to review and then she would get a call.    Also requesting an antibiotic for her cough. Reviewed notes from visit and reiterated advise given during visit.  Patient voiced understanding.

## 2016-04-04 NOTE — Telephone Encounter (Signed)
Called patient's cell number [phone]) and left VM to call [facility] for message from Dr. Duffy RhodyStanley.

## 2016-04-04 NOTE — Telephone Encounter (Signed)
called patient back and reported lab result. Advised to call first in the morning to schedule same day appointment to evaluate her cough. Patient voiced understanding and agreed.

## 2016-04-04 NOTE — Telephone Encounter (Signed)
Pt called returning a nurse call, ok to call her back today.

## 2016-04-04 NOTE — Telephone Encounter (Signed)
Labs were all fine - normal cholesterol studies and hemoglobin.  Symptomatic care for cough and no phone call in for antibiotic; please assist with appointment if she needs to be seen or access to more urgent care.

## 2016-04-12 ENCOUNTER — Emergency Department (HOSPITAL_COMMUNITY)
Admission: EM | Admit: 2016-04-12 | Discharge: 2016-04-12 | Disposition: A | Discharge status: Home / Self Care | Payer: Medicaid Other | Attending: Emergency Medicine | Admitting: Emergency Medicine

## 2016-04-12 ENCOUNTER — Encounter (HOSPITAL_COMMUNITY): Payer: Self-pay | Admitting: *Deleted

## 2016-04-12 DIAGNOSIS — N760 Acute vaginitis: Secondary | ICD-10-CM | POA: Insufficient documentation

## 2016-04-12 DIAGNOSIS — N939 Abnormal uterine and vaginal bleeding, unspecified: Secondary | ICD-10-CM | POA: Diagnosis present

## 2016-04-12 DIAGNOSIS — B9689 Other specified bacterial agents as the cause of diseases classified elsewhere: Secondary | ICD-10-CM | POA: Diagnosis not present

## 2016-04-12 DIAGNOSIS — Z7722 Contact with and (suspected) exposure to environmental tobacco smoke (acute) (chronic): Secondary | ICD-10-CM | POA: Diagnosis not present

## 2016-04-12 LAB — I-STAT BETA HCG BLOOD, ED (MC, WL, AP ONLY): I-stat hCG, quantitative: <5 m[IU]/mL (ref <5)

## 2016-04-12 LAB — WET PREP, GENITAL
SPERM: NONE SEEN
Trich, Wet Prep: NONE SEEN
YEAST WET PREP: NONE SEEN

## 2016-04-12 MED ORDER — METRONIDAZOLE 500 MG PO TABS: 500.0000 mg | ORAL_TABLET | Freq: Two times a day (BID) | ORAL | 0 refills | Status: DC

## 2016-04-12 NOTE — ED Provider Notes (Signed)
[facility]-EMERGENCY DEPT Provider Note   CSN: [id number] Arrival date & time: 04/12/16  1225   By signing my name below, I, Soijett Blue, attest that this documentation has been prepared under the direction and in the presence of Doug Sou, MD. Electronically Signed: Soijett Blue, ED Scribe. 04/12/16. 1:53 PM.  History   Chief Complaint Chief Complaint  Patient presents with  . Vaginal Bleeding    HPI Gabriela Jones is a 21 y.o. female who presents to the Emergency Department complaining of resolved vaginal bleeding onset this morning. Pt reports associated mild bilateral Flank pain. Pt has not tried any medications for the relief of her symptoms. She states that her bilateral side flank is worsened with sitting and alleviated with laying down. She notes that she noticed a blood clot come out of her vagina this morning that has since resolved. No further bleeding Pt reports that her last menstrual cycle was in January 2018 and that is not abnormal for her. She reports that sometimes she has two periods a month, but denies having a gynecologist. She denies appetite change, vaginal discharge, fever, and any other symptoms. Denies PMHx, abdominal surgeries, ETOH use, smoking cigarettes, illegal drug use, or allergies to medications. No other associated symptoms   The history is provided by the patient. No language interpreter was used.    History reviewed. No pertinent past medical history.  Patient Active Problem List   Diagnosis Date Noted  . Acne vulgaris 03/30/2016    Past Surgical History:  Procedure Laterality Date  . TONSILLECTOMY      OB History    No data available     Gravida 0  Home Medications    Prior to Admission medications   Medication Sig Start Date End Date Taking? Authorizing Provider  DIFFERIN 0.1 % cream APPLY TOPICALLY AT BEDTIME TO AFFECTED AREAS OF ACNE AFTER CLEANSING FACE 03/30/16   Maree Erie, MD  naproxen (NAPROSYN) 375 MG tablet Take one  tablet by mouth every 12 hours as needed, with food, for relief of back pain Patient not taking: Reported on 07/04/2015 06/15/15   Maree Erie, MD  polyethylene glycol powder (GLYCOLAX/MIRALAX) powder MIX ONE CAPFUL IN 8 OUNCES OF LIQUID AND DRINK DAILY FOR RELIEF OF CONSTIPATION Patient not taking: Reported on 03/30/2016 02/06/16   Maree Erie, MD    Family History Family History  Problem Relation Age of Onset  . Hypertension Father     Social History Social History  Substance Use Topics  . Smoking status: Passive Smoke Exposure - Never Smoker  . Smokeless tobacco: Never Used  . Alcohol use No     Allergies   Patient has no known allergies.   Review of Systems Review of Systems  Constitutional: Negative.  Negative for appetite change and fever.  HENT: Negative.   Respiratory: Negative.   Cardiovascular: Negative.   Gastrointestinal: Negative.   Genitourinary: Positive for flank pain and vaginal bleeding. Negative for vaginal discharge.       Bilateral flank pain. Irregular menses  Skin: Negative.   Neurological: Negative.   Psychiatric/Behavioral: Negative.   All other systems reviewed and are negative.    Physical Exam Updated Vital Signs BP (!) 141/75 (BP Location: Right Arm)   Pulse (!) 106   Temp 98.1 F (36.7 C) (Oral)   Resp 18   Ht  (1.778 m)   Wt 184 lb (83.5 kg)   LMP 01/09/2016 (Approximate) Comment: no menstrual since January  SpO2 100%  BMI 26.40 kg/m   Physical Exam  Constitutional: She is oriented to person, place, and time. She appears well-developed and well-nourished. No distress.  HENT:  Head: Normocephalic and atraumatic.  Eyes: Conjunctivae are normal.  Neck: Neck supple.  Cardiovascular: Normal heart sounds.  Exam reveals no gallop and no friction rub.   No murmur heard. Mildly tachycardic  Pulmonary/Chest: Effort normal and breath sounds normal. No respiratory distress. She has no wheezes. She has no rales.    Abdominal: Soft. She exhibits no distension.  Genitourinary:  Genitourinary Comments: Pelvic exam no external lesion. White discharge in vault. No blood in vault. Cervical os closed. No cervical motion tenderness no adnexal masses or tenderness  Musculoskeletal: Normal range of motion.  Neurological: She is alert and oriented to person, place, and time.  Skin: Skin is warm and dry.  Psychiatric: She has a normal mood and affect. Her behavior is normal.  Nursing note and vitals reviewed.    ED Treatments / Results  DIAGNOSTIC STUDIES: Oxygen Saturation is 100% on RA, nl by my interpretation.    COORDINATION OF CARE: 1:53 PM Discussed treatment plan with pt at bedside which includes labs, pelvic exam, and pt agreed to plan.   Labs (all labs ordered are listed, but only abnormal results are displayed) Labs Reviewed  I-STAT BETA HCG BLOOD, ED ([facility], [facility], [facility] ONLY)    EKG  EKG Interpretation None       Radiology No results found.  Procedures Procedures (including critical care time)  Medications Ordered in ED Medications - No data to display  Declines pain medicine Results for orders placed or performed during the hospital encounter of 04/12/16  Wet prep, genital  Result Value Ref Range   Yeast Wet Prep HPF POC NONE SEEN NONE SEEN   Trich, Wet Prep NONE SEEN NONE SEEN   Clue Cells Wet Prep HPF POC PRESENT (A) NONE SEEN   WBC, Wet Prep HPF POC MANY (A) NONE SEEN   Sperm NONE SEEN   I-Stat beta hCG blood, ED  Result Value Ref Range   I-stat hCG, quantitative <5.0 <5 mIU/mL   Comment 3           No results found. Initial Impression / Assessment and Plan / ED Course  I have reviewed the triage vital signs and the nursing notes.  Pertinent labs & imaging results that were available during my care of the patient were reviewed by me and considered in my medical decision making (see chart for details).     Plan prescription Flagyl. Tylenol as needed for aches. Referral  wounds outpatient clinic. Cervical cultures, HIV and RPR pending  Final Clinical Impressions(s) / ED Diagnoses  Diagnosis #1 abnormal vaginal bleeding #2 bacterial vaginosis Final diagnoses:  None    New Prescriptions New Prescriptions   No medications on file       Doug Sou, MD 04/12/16 1439

## 2016-04-12 NOTE — Discharge Instructions (Signed)
Take Tylenol every 4 hours as needed for pain. Make sure you finish the antibiotic as prescribed Call the women's outpatient clinic to schedule the next available appointment to be further checked for the cause behind your abnormal periods. Return if concern for any reason

## 2016-04-12 NOTE — ED Triage Notes (Signed)
Pt stats she had a blood clot fall out of her vagina into the toilet this am and had a little bleeding that stopped.  She complains of lateral side abdominal pain.  States last menstrual was in January and that is abnormal for her.  Not sure if she was pregnant

## 2016-04-13 LAB — GC/CHLAMYDIA PROBE AMP (~~LOC~~) NOT AT ARMC
Chlamydia: NEGATIVE
Neisseria Gonorrhea: NEGATIVE

## 2016-04-13 LAB — HIV ANTIBODY (ROUTINE TESTING W REFLEX): HIV SCREEN 4TH GENERATION: NONREACTIVE

## 2016-04-13 LAB — RPR: RPR Ser Ql: NONREACTIVE

## 2016-05-30 LAB — GLUCOSE, POCT (MANUAL RESULT ENTRY): POC GLUCOSE: 97 mg/dL (ref 70–99)

## 2017-02-05 ENCOUNTER — Ambulatory Visit (INDEPENDENT_AMBULATORY_CARE_PROVIDER_SITE_OTHER): Payer: Self-pay | Admitting: Physician Assistant

## 2017-02-05 ENCOUNTER — Ambulatory Visit (HOSPITAL_COMMUNITY)
Admission: RE | Admit: 2017-02-05 | Discharge: 2017-02-05 | Disposition: A | Discharge status: Home / Self Care | Payer: Self-pay | Source: Ambulatory Visit | Attending: Physician Assistant | Admitting: Physician Assistant

## 2017-02-05 ENCOUNTER — Encounter (INDEPENDENT_AMBULATORY_CARE_PROVIDER_SITE_OTHER): Payer: Self-pay | Admitting: Physician Assistant

## 2017-02-05 VITALS — BP 115/72 | HR 98 | Temp 98.2°F | Resp 18 | Ht 70.0 in | Wt 198.0 lb

## 2017-02-05 DIAGNOSIS — R05 Cough: Secondary | ICD-10-CM | POA: Insufficient documentation

## 2017-02-05 DIAGNOSIS — R059 Cough, unspecified: Secondary | ICD-10-CM

## 2017-02-05 MED ORDER — CETIRIZINE HCL 10 MG PO TABS: 10.0000 mg | ORAL_TABLET | Freq: Every day | ORAL | 11 refills | Status: DC

## 2017-02-05 NOTE — Patient Instructions (Signed)
Tuberculin Skin Test  Why am I having this test?  Tuberculosis (TB) is a bacterial infection caused by Mycobacterium tuberculosis. Most people who are exposed to these bacteria have a strong enough defense (immune) system to prevent the bacteria from causing TB and developing symptoms. Their bodies prevent the germs from being active and making them sick (latent TB infection).  However, if you have TB germs in your body and your immune system is weak, you can develop a TB infection. This can cause symptoms such as:  · Night sweats.  · Fever.  · Weakness.  · Weight loss.    A latent TB infection can also become active later in life if your immune system becomes weakened or compromised.  You may have this test if your health care provider suspects that you have TB. You may also have this test to screen for TB if you are at risk for getting the disease. Those at increased risk include:  · People who inject illegal drugs or share needles.  · People with HIV or other diseases that affect immunity.  · Health care workers.  · People who live in high-risk communities, such as homeless shelters, nursing homes, and correctional facilities.  · People who have been in contact with someone with TB.  · People from countries where TB is more common.    If you are in a high-risk group, your health care provider may wish to screen for TB more often. This can help prevent the spread of the disease. Sometimes TB screening is required when starting a new job, such as becoming a health care worker or a teacher. Colleges or universities may require it of new students.  What is being tested?  A tuberculin skin test is the main test used to check for exposure to the bacteria that can cause TB. The test checks for antibodies to the bacteria. Antibodies are proteins that your body produces to protect you from germs and other things that can make you sick.  Your health care provider will inject a solution known as PPD (purified  protein derivative) under the first layer of skin on your arm. This causes a blister-like bubble to form at the site. Your health care provider will then examine the site after a number of hours have passed to see if a reaction has occurred.  How do I prepare for this test?  There is no preparation required for this test.  What do the results mean?  Your test results will be reported as either negative or positive.  If the tuberculin skin test produces a negative result, it is likely that you do not have TB and have not been exposed to the TB bacteria.  If you or your health care provider suspects exposure, however, you may want to repeat the test a few weeks later. A blood test may also be used to check for TB. This is because you will not react to the tuberculin skin test until several weeks after exposure to TB bacteria.  If you test positive to the tuberculin skin test, it is likely that you have been exposed to TB bacteria. The test does not distinguish between an active and a latent TB infection.  A false-positive result can occur. A false-positive result for TB bacteria is incorrect because it indicates a condition or finding is present when it is not.  Talk to your health care provider to discuss your results, treatment options, and if necessary, the need for more tests.    It is your responsibility to obtain your test results. Ask the lab or department performing the test when and how you will get your results. Talk with your health care provider if you have any questions about your results.  Talk with your health care provider to discuss your results, treatment options, and if necessary, the need for more tests. Talk with your health care provider if you have any questions about your results.  This information is not intended to replace advice given to you by your health care provider. Make sure you discuss any questions you have with your health care provider.   Document Released: 10/04/2004 Document Revised: 08/28/2015 Document Reviewed: 04/20/2013  Elsevier Interactive Patient Education © 2018 [employer]vier Inc.

## 2017-02-05 NOTE — Progress Notes (Signed)
   Subjective:  Patient ID: Gabriela Jones, female    DOB: 04/06/1995  Age: 22 y.o. MRN: [id number]  CC: productive cough  HPI Gabriela Jones is a 22 y.o. female with a medical history of abnormal vaginal bleeding, constipation, tonsillitis, and genital lesions presents as a new patient with complaint of brown productive cough for one year. Cough is "all day everyday". Does not endorse exposure to someone ill with similar symptoms and has no overseas travel. Does not endorse f/c/n/v, chest pain, SOB, wheezing, exertional dyspnea, abdominal pain, rash, or GI/GU sxs. Denies smoking, drinking, or drug use.         Outpatient Medications Prior to Visit  Medication Sig Dispense Refill  . DIFFERIN 0.1 % cream APPLY TOPICALLY AT BEDTIME TO AFFECTED AREAS OF ACNE AFTER CLEANSING FACE 45 g 2  . metroNIDAZOLE (FLAGYL) 500 MG tablet Take 1 tablet (500 mg total) by mouth 2 (two) times daily. One po bid x 7 days 14 tablet 0  . naproxen (NAPROSYN) 375 MG tablet Take one tablet by mouth every 12 hours as needed, with food, for relief of back pain (Patient not taking: Reported on 07/04/2015) 20 tablet 0  . polyethylene glycol powder (GLYCOLAX/MIRALAX) powder MIX ONE CAPFUL IN 8 OUNCES OF LIQUID AND DRINK DAILY FOR RELIEF OF CONSTIPATION (Patient not taking: Reported on 03/30/2016) 255 g 0   No facility-administered medications prior to visit.      ROS Review of Systems  Constitutional: Negative for chills, fever and malaise/fatigue.  Eyes: Negative for blurred vision.  Respiratory: Positive for cough. Negative for shortness of breath.   Cardiovascular: Negative for chest pain and palpitations.  Gastrointestinal: Negative for abdominal pain and nausea.  Genitourinary: Negative for dysuria and hematuria.  Musculoskeletal: Negative for joint pain and myalgias.  Skin: Negative for rash.  Neurological: Negative for tingling and headaches.  Psychiatric/Behavioral: Negative for depression. The patient is  not nervous/anxious.     Objective:     Physical Exam  Constitutional: She is oriented to person, place, and time.  Well developed, well nourished, NAD, polite  HENT:  Head: Normocephalic and atraumatic.  Mild postnasal drip, turbinates mildly hypertrophic.  Eyes: No scleral icterus.  Neck: Normal range of motion. Neck supple. No thyromegaly present.  Cardiovascular: Normal rate, regular rhythm and normal heart sounds.  Pulmonary/Chest: Effort normal and breath sounds normal.  Abdominal: Soft. Bowel sounds are normal. There is no tenderness.  Musculoskeletal: She exhibits no edema.  Neurological: She is alert and oriented to person, place, and time.  Skin: Skin is warm and dry. No rash noted. No erythema. No pallor.  Psychiatric: She has a normal mood and affect. Her behavior is normal. Thought content normal.  Vitals reviewed.    Assessment & Plan:    1. Cough - DG Chest 2 View; Future - CBC with Differential - Comprehensive metabolic panel - PPD - Begin Cetirizine 10 mg qday    Follow-up: Return in about 2 days (around 02/07/2017) for PPD reading and discuss results.Loletta Specter.   Trevon Strothers David Brentley Landfair PA

## 2017-02-06 ENCOUNTER — Telehealth (INDEPENDENT_AMBULATORY_CARE_PROVIDER_SITE_OTHER): Payer: Self-pay | Admitting: *Deleted

## 2017-02-06 LAB — CBC WITH DIFFERENTIAL/PLATELET
BASOS ABS: 0 10*3/uL (ref 0.0–0.2)
BASOS: 1 %
EOS (ABSOLUTE): 0.1 10*3/uL (ref 0.0–0.4)
EOS: 1 %
HEMATOCRIT: 36.4 % (ref 34.0–46.6)
HEMOGLOBIN: 12.2 g/dL (ref 11.1–15.9)
IMMATURE GRANS (ABS): 0 10*3/uL (ref 0.0–0.1)
Immature Granulocytes: 0 %
LYMPHS: 36 %
Lymphocytes Absolute: 1.4 10*3/uL (ref 0.7–3.1)
MCH: 28.5 pg (ref 26.6–33.0)
MCHC: 33.5 g/dL (ref 31.5–35.7)
MCV: 85 fL (ref 79–97)
MONOCYTES: 5 %
Monocytes Absolute: 0.2 10*3/uL (ref 0.1–0.9)
NEUTROS ABS: 2.3 10*3/uL (ref 1.4–7.0)
Neutrophils: 57 %
Platelets: 195 10*3/uL (ref 150–379)
RBC: 4.28 x10E6/uL (ref 3.77–5.28)
RDW: 14.2 % (ref 12.3–15.4)
WBC: 4 10*3/uL (ref 3.4–10.8)

## 2017-02-06 LAB — COMPREHENSIVE METABOLIC PANEL
ALBUMIN: 4.4 g/dL (ref 3.5–5.5)
ALT: 7 IU/L (ref 0–32)
AST: 8 IU/L (ref 0–40)
Albumin/Globulin Ratio: 1.7 (ref 1.2–2.2)
Alkaline Phosphatase: 58 IU/L (ref 39–117)
BUN / CREAT RATIO: 10 (ref 9–23)
BUN: 7 mg/dL (ref 6–20)
CHLORIDE: 102 mmol/L (ref 96–106)
CO2: 23 mmol/L (ref 20–29)
CREATININE: 0.7 mg/dL (ref 0.57–1.00)
Calcium: 9.3 mg/dL (ref 8.7–10.2)
GFR calc non Af Amer: 124 mL/min/{1.73_m2} (ref >59)
GFR, EST AFRICAN AMERICAN: 143 mL/min/{1.73_m2} (ref >59)
GLOBULIN, TOTAL: 2.6 g/dL (ref 1.5–4.5)
GLUCOSE: 80 mg/dL (ref 65–99)
Potassium: 4.1 mmol/L (ref 3.5–5.2)
Sodium: 139 mmol/L (ref 134–144)
TOTAL PROTEIN: 7 g/dL (ref 6.0–8.5)

## 2017-02-06 NOTE — Telephone Encounter (Signed)
Medical Assistant left message on patient's home and cell voicemail. Voicemail states to give a call back to [place] [facility] at [phone]. Patient is aware of chest xray being normal and labs being normal.

## 2017-02-06 NOTE — Telephone Encounter (Signed)
-----   Message from Loletta Specteroger David Gomez, PA-C sent at 02/06/2017  8:43 AM EST ----- Normal blood work.

## 2017-02-07 ENCOUNTER — Other Ambulatory Visit (INDEPENDENT_AMBULATORY_CARE_PROVIDER_SITE_OTHER): Payer: Medicaid Other

## 2017-02-07 LAB — TB SKIN TEST
Induration: <5 mm
TB SKIN TEST: NEGATIVE

## 2017-02-07 NOTE — Progress Notes (Signed)
Patient returned for TB reading.

## 2017-02-12 ENCOUNTER — Encounter (INDEPENDENT_AMBULATORY_CARE_PROVIDER_SITE_OTHER): Payer: Self-pay | Admitting: Physician Assistant

## 2017-02-19 ENCOUNTER — Other Ambulatory Visit: Payer: Self-pay

## 2017-02-19 ENCOUNTER — Encounter (HOSPITAL_COMMUNITY): Payer: Self-pay | Admitting: *Deleted

## 2017-02-19 ENCOUNTER — Emergency Department (HOSPITAL_COMMUNITY)
Admission: EM | Admit: 2017-02-19 | Discharge: 2017-02-19 | Disposition: A | Discharge status: Home / Self Care | Payer: Medicaid Other | Attending: Emergency Medicine | Admitting: Emergency Medicine

## 2017-02-19 DIAGNOSIS — Z79899 Other long term (current) drug therapy: Secondary | ICD-10-CM | POA: Insufficient documentation

## 2017-02-19 DIAGNOSIS — K529 Noninfective gastroenteritis and colitis, unspecified: Secondary | ICD-10-CM | POA: Insufficient documentation

## 2017-02-19 DIAGNOSIS — Z7722 Contact with and (suspected) exposure to environmental tobacco smoke (acute) (chronic): Secondary | ICD-10-CM | POA: Insufficient documentation

## 2017-02-19 LAB — COMPREHENSIVE METABOLIC PANEL
ALBUMIN: 4.2 g/dL (ref 3.5–5.0)
ALT: 9 U/L — ABNORMAL LOW (ref 14–54)
ANION GAP: 11 (ref 5–15)
AST: 15 U/L (ref 15–41)
Alkaline Phosphatase: 52 U/L (ref 38–126)
BUN: 6 mg/dL (ref 6–20)
CHLORIDE: 105 mmol/L (ref 101–111)
CO2: 22 mmol/L (ref 22–32)
Calcium: 9.4 mg/dL (ref 8.9–10.3)
Creatinine, Ser: 0.81 mg/dL (ref 0.44–1.00)
GFR calc Af Amer: >60 mL/min (ref >60)
GFR calc non Af Amer: >60 mL/min (ref >60)
GLUCOSE: 108 mg/dL — AB (ref 65–99)
POTASSIUM: 4.2 mmol/L (ref 3.5–5.1)
Sodium: 138 mmol/L (ref 135–145)
Total Bilirubin: 0.8 mg/dL (ref 0.3–1.2)
Total Protein: 7.2 g/dL (ref 6.5–8.1)

## 2017-02-19 LAB — I-STAT BETA HCG BLOOD, ED (MC, WL, AP ONLY): I-stat hCG, quantitative: <5 m[IU]/mL (ref <5)

## 2017-02-19 LAB — CBC
HCT: 39.7 % (ref 36.0–46.0)
Hemoglobin: 12.6 g/dL (ref 12.0–15.0)
MCH: 27.9 pg (ref 26.0–34.0)
MCHC: 31.7 g/dL (ref 30.0–36.0)
MCV: 88 fL (ref 78.0–100.0)
PLATELETS: 208 10*3/uL (ref 150–400)
RBC: 4.51 MIL/uL (ref 3.87–5.11)
RDW: 13.6 % (ref 11.5–15.5)
WBC: 6.7 10*3/uL (ref 4.0–10.5)

## 2017-02-19 LAB — URINALYSIS, ROUTINE W REFLEX MICROSCOPIC
BILIRUBIN URINE: NEGATIVE
GLUCOSE, UA: NEGATIVE mg/dL
Hgb urine dipstick: NEGATIVE
KETONES UR: NEGATIVE mg/dL
Leukocytes, UA: NEGATIVE
NITRITE: NEGATIVE
PROTEIN: NEGATIVE mg/dL
Specific Gravity, Urine: 1.008 (ref 1.005–1.030)
pH: 6 (ref 5.0–8.0)

## 2017-02-19 LAB — LIPASE, BLOOD: LIPASE: 26 U/L (ref 11–51)

## 2017-02-19 MED ORDER — SODIUM CHLORIDE 0.9 % IV BOLUS (SEPSIS)
1000.0000 mL | Freq: Once | INTRAVENOUS | Status: AC
  Administered 2017-02-19: 1000 mL via INTRAVENOUS

## 2017-02-19 MED ORDER — ONDANSETRON HCL 4 MG PO TABS: 4.0000 mg | ORAL_TABLET | Freq: Four times a day (QID) | ORAL | 0 refills | Status: DC

## 2017-02-19 MED ORDER — ONDANSETRON HCL 4 MG/2ML IJ SOLN
4.0000 mg | Freq: Once | INTRAMUSCULAR | Status: AC
  Administered 2017-02-19: 4 mg via INTRAVENOUS
  Filled 2017-02-19: qty 2

## 2017-02-19 NOTE — ED Notes (Signed)
Declined W/C at D/C and was escorted to lobby by RN. 

## 2017-02-19 NOTE — ED Provider Notes (Signed)
[name] Provider Note   CSN: [id number] Arrival date & time: 02/19/17  1142     History   Chief Complaint Chief Complaint  Patient presents with  . Abdominal Pain    HPI Gabriela Jones is a 22 y.o. female.  HPI   22 year old female presents today with complaints of generalized abdominal pain and diarrhea.  Patient notes symptoms started last night.  She notes she is nauseous and unable to tolerate food or drink.  She denies any bloody diarrhea, denies any fever, denies any localized abdominal pain.  She notes that everyone in her house is sick with similar symptoms.  Patient denies any significant past medical history.  History reviewed. No pertinent past medical history.  Patient Active Problem List   Diagnosis Date Noted  . Acne vulgaris 03/30/2016    Past Surgical History:  Procedure Laterality Date  . TONSILLECTOMY      OB History    No data available       Home Medications    Prior to Admission medications   Medication Sig Start Date End Date Taking? Authorizing Provider  cetirizine (ZYRTEC) 10 MG tablet Take 1 tablet (10 mg total) by mouth daily. 02/05/17   Loletta SpecterGomez, Roger David, PA-C  DIFFERIN 0.1 % cream APPLY TOPICALLY AT BEDTIME TO AFFECTED AREAS OF ACNE AFTER CLEANSING FACE 03/30/16   Maree ErieStanley, Angela J, MD  ondansetron (ZOFRAN) 4 MG tablet Take 1 tablet (4 mg total) by mouth every 6 (six) hours. 02/19/17   Eyvonne MechanicHedges, Kristol Almanzar, PA-C    Family History Family History  Problem Relation Age of Onset  . Hypertension Father     Social History Social History   Tobacco Use  . Smoking status: Passive Smoke Exposure - Never Smoker  . Smokeless tobacco: Never Used  Substance Use Topics  . Alcohol use: No  . Drug use: No     Allergies   Patient has no known allergies.   Review of Systems Review of Systems  All other systems reviewed and are negative.  Physical Exam Updated Vital Signs BP 115/68 (BP Location:  Right Arm)   Pulse 87   Temp 98.8 F (37.1 C) (Oral)   Resp 20   LMP 02/04/2017   SpO2 98%   Physical Exam  Constitutional: She is oriented to person, place, and time. She appears well-developed and well-nourished.  HENT:  Head: Normocephalic and atraumatic.  Eyes: Conjunctivae are normal. Pupils are equal, round, and reactive to light. Right eye exhibits no discharge. Left eye exhibits no discharge. No scleral icterus.  Neck: Normal range of motion. No JVD present. No tracheal deviation present.  Pulmonary/Chest: Effort normal. No stridor.  Abdominal: Soft.  Generalized minorTTP- non focal   Neurological: She is alert and oriented to person, place, and time. Coordination normal.  Psychiatric: She has a normal mood and affect. Her behavior is normal. Judgment and thought content normal.  Nursing note and vitals reviewed.    ED Treatments / Results  Labs (all labs ordered are listed, but only abnormal results are displayed) Labs Reviewed  COMPREHENSIVE METABOLIC PANEL - Abnormal; Notable for the following components:      Result Value   Glucose, Bld 108 (*)    ALT 9 (*)    All other components within normal limits  URINALYSIS, ROUTINE W REFLEX MICROSCOPIC - Abnormal; Notable for the following components:   Color, Urine STRAW (*)    All other components within normal limits  LIPASE, BLOOD  CBC  I-STAT BETA HCG BLOOD, ED ([facility], [facility], [facility] ONLY)    EKG  EKG Interpretation None       Radiology No results found.  Procedures Procedures (including critical care time)  Medications Ordered in ED Medications  sodium chloride 0.9 % bolus 1,000 mL (0 mLs Intravenous Stopped 02/19/17 1423)  ondansetron (ZOFRAN) injection 4 mg (4 mg Intravenous Given 02/19/17 1322)     Initial Impression / Assessment and Plan / ED Course  I have reviewed the triage vital signs and the nursing notes.  Pertinent labs & imaging results that were available during my care of the patient were  reviewed by me and considered in my medical decision making (see chart for details).      Final Clinical Impressions(s) / ED Diagnoses   Final diagnoses:  Gastroenteritis    Labs: CBC, CMP, lipase  Imaging:  Consults:  Therapeutics: Zofran  Discharge Meds: Zofran  Assessment/Plan: 22 year old female presents today with likely viral gastroenteritis.  She has a very benign abdomen, nonfocal.  Her labs are reassuring, she was given fluids, antinausea medication.  She is tolerating p.o. distress.  She will be discharged home with antinausea medication, strict return precautions.  Patient verbalized understanding and agreement to today's plan had no further questions or concerns at the time of discharge.   ED Discharge Orders        Ordered    ondansetron (ZOFRAN) 4 MG tablet  Every 6 hours     02/19/17 1434       Eyvonne Mechanic, PA-C 02/19/17 1815    Gwyneth Sprout, MD 02/19/17 2204

## 2017-02-19 NOTE — Discharge Instructions (Signed)
Please read attached information. If you experience any new or worsening signs or symptoms please return to the emergency room for evaluation. Please follow-up with your primary care provider or specialist as discussed. Please use medication prescribed only as directed and discontinue taking if you have any concerning signs or symptoms.   °

## 2017-02-19 NOTE — ED Triage Notes (Signed)
Pt reports abdominal pain N/V/D since last night

## 2017-02-28 ENCOUNTER — Ambulatory Visit (INDEPENDENT_AMBULATORY_CARE_PROVIDER_SITE_OTHER): Payer: Medicaid Other | Admitting: Physician Assistant

## 2017-03-07 ENCOUNTER — Ambulatory Visit (INDEPENDENT_AMBULATORY_CARE_PROVIDER_SITE_OTHER): Payer: Medicaid Other | Admitting: Physician Assistant

## 2017-03-08 ENCOUNTER — Ambulatory Visit (INDEPENDENT_AMBULATORY_CARE_PROVIDER_SITE_OTHER): Payer: Medicaid Other | Admitting: Physician Assistant

## 2017-03-13 ENCOUNTER — Emergency Department (HOSPITAL_COMMUNITY)
Admission: EM | Admit: 2017-03-13 | Discharge: 2017-03-13 | Disposition: A | Discharge status: Home / Self Care | Payer: Medicaid Other | Attending: Emergency Medicine | Admitting: Emergency Medicine

## 2017-03-13 ENCOUNTER — Other Ambulatory Visit: Payer: Self-pay

## 2017-03-13 ENCOUNTER — Encounter (HOSPITAL_COMMUNITY): Payer: Self-pay

## 2017-03-13 ENCOUNTER — Emergency Department (HOSPITAL_COMMUNITY): Payer: Medicaid Other

## 2017-03-13 DIAGNOSIS — W231XXA Caught, crushed, jammed, or pinched between stationary objects, initial encounter: Secondary | ICD-10-CM | POA: Insufficient documentation

## 2017-03-13 DIAGNOSIS — Y929 Unspecified place or not applicable: Secondary | ICD-10-CM | POA: Insufficient documentation

## 2017-03-13 DIAGNOSIS — Y999 Unspecified external cause status: Secondary | ICD-10-CM | POA: Insufficient documentation

## 2017-03-13 DIAGNOSIS — Y939 Activity, unspecified: Secondary | ICD-10-CM | POA: Insufficient documentation

## 2017-03-13 DIAGNOSIS — S6992XA Unspecified injury of left wrist, hand and finger(s), initial encounter: Secondary | ICD-10-CM | POA: Insufficient documentation

## 2017-03-13 DIAGNOSIS — Z79899 Other long term (current) drug therapy: Secondary | ICD-10-CM | POA: Insufficient documentation

## 2017-03-13 DIAGNOSIS — Z7722 Contact with and (suspected) exposure to environmental tobacco smoke (acute) (chronic): Secondary | ICD-10-CM | POA: Insufficient documentation

## 2017-03-13 NOTE — ED Notes (Signed)
Pt verbalized understanding of discharge instructions and denies any further questions at this time.   

## 2017-03-13 NOTE — Progress Notes (Signed)
Orthopedic Tech Progress Note Patient Details:  Gabriela Jones 06/16/1995 [id number]  Ortho Devices Type of Ortho Device: Thumb velcro splint Ortho Device/Splint Location: lue Ortho Device/Splint Interventions: Application   Post Interventions Patient Tolerated: Well Instructions Provided: Care of device   Nikki DomCrawford, Valari Taylor 03/13/2017, 2:30 PM

## 2017-03-13 NOTE — ED Provider Notes (Signed)
[name] EMERGENCY DEPARTMENT Provider Note   CSN: [id number] Arrival date & time: 03/13/17  1144     History   Chief Complaint Chief Complaint  Patient presents with  . Hand Injury    HPI Gabriela Jones is a 22 y.o. female is here for evaluation of left thumb pain that began yesterday. The pain is described as throbbing and constant but worse with palpation and movement. There is decrease of motion secondary to pain and swelling. Other associated symptoms include small skin tear and bruising to the base of her left thumb. States that she got her hand stuck in a door yesterday but cannot tell me the details are held this happened. No interventions PTA. Denies paresthesias distally.  HPI  History reviewed. No pertinent past medical history.  Patient Active Problem List   Diagnosis Date Noted  . Acne vulgaris 03/30/2016    Past Surgical History:  Procedure Laterality Date  . TONSILLECTOMY      OB History    No data available       Home Medications    Prior to Admission medications   Medication Sig Start Date End Date Taking? Authorizing Provider  cetirizine (ZYRTEC) 10 MG tablet Take 1 tablet (10 mg total) by mouth daily. 02/05/17   Loletta Specter, PA-C  DIFFERIN 0.1 % cream APPLY TOPICALLY AT BEDTIME TO AFFECTED AREAS OF ACNE AFTER CLEANSING FACE 03/30/16   Maree Erie, MD  ondansetron (ZOFRAN) 4 MG tablet Take 1 tablet (4 mg total) by mouth every 6 (six) hours. 02/19/17   Eyvonne Mechanic, PA-C    Family History Family History  Problem Relation Age of Onset  . Hypertension Father     Social History Social History   Tobacco Use  . Smoking status: Passive Smoke Exposure - Never Smoker  . Smokeless tobacco: Never Used  Substance Use Topics  . Alcohol use: No  . Drug use: No     Allergies   Patient has no known allergies.   Review of Systems Review of Systems  Musculoskeletal: Positive for arthralgias.  Skin: Positive for  color change and wound.  All other systems reviewed and are negative.    Physical Exam Updated Vital Signs BP 125/82 (BP Location: Right Arm)   Pulse (!) 103   Temp 98.7 F (37.1 C) (Oral)   Resp 18   Ht 5\' 10"  (1.778 m)   Wt 86.2 kg (190 lb)   LMP 03/07/2017 (Approximate)   SpO2 100%   BMI 27.26 kg/m   Physical Exam  Constitutional: She is oriented to person, place, and time. She appears well-developed and well-nourished.  Non-toxic appearance.  HENT:  Head: Normocephalic.  Right Ear: External ear normal.  Left Ear: External ear normal.  Nose: Nose normal.  Eyes: Conjunctivae and EOM are normal.  Neck: Full passive range of motion without pain.  Cardiovascular: Normal rate.  Pulmonary/Chest: Effort normal. No tachypnea. No respiratory distress.  Musculoskeletal: Normal range of motion. She exhibits edema and tenderness.  1 cm skin tear to the dorsal aspect of the left thumb near the base. There is mild edema and ecchymosis around the left thumb maximal failing. Decreased thumb range of motion secondary to pain however patient is still able to oppose thumb to all her fingertips. No focal tenderness to wrist bones or scaphoid. Full painless active range of motion of the wrist.  Neurological: She is alert and oriented to person, place, and time.  Skin: Skin is warm  and dry. Capillary refill takes less than 2 seconds.  Psychiatric: Her behavior is normal. Thought content normal.     ED Treatments / Results  Labs (all labs ordered are listed, but only abnormal results are displayed) Labs Reviewed - No data to display  EKG  EKG Interpretation None       Radiology Dg Hand Complete Left  Result Date: 03/13/2017 CLINICAL DATA:  Left thumb pain and swelling for the past 2 days after her hand was slammed in a door at her house. EXAM: LEFT HAND - COMPLETE 3+ VIEW COMPARISON:  None in PACs FINDINGS: The bones of the left hand are subjectively adequately mineralized. There is  no acute or healing fracture. The joint spaces are well-maintained. Specific attention to the thumb reveals no acute abnormality. IMPRESSION: There is no acute bony abnormality of the left thumb. Electronically Signed   By: David  [place]azil[place] M.D.   On: 03/13/2017 13:41    Procedures Procedures (including critical care time)  Medications Ordered in ED Medications - No data to display   Initial Impression / Assessment and Plan / ED Course  I have reviewed the triage vital signs and the nursing notes.  Pertinent labs & imaging results that were available during my care of the patient were reviewed by me and considered in my medical decision making (see chart for details).    Patient here with left thumb pain after trauma. X-ray is negative. She has a small skin tear. Tetanus is up-to-date. Extremity is neurovascularly intact. She is requesting splint which will be given to her. We'll discharge with ice and high-dose NSAIDs.  Final Clinical Impressions(s) / ED Diagnoses   Final diagnoses:  Injury of left thumb, initial encounter    ED Discharge Orders    None       Liberty HandyGibbons, Maitlyn Penza J, PA-C 03/13/17 1412    Mancel BaleWentz, Elliott, MD 03/13/17 2233

## 2017-03-13 NOTE — ED Triage Notes (Signed)
Pt endorses having her right hand closed in a door yesterday and has a small laceration. Unable to make fish. CMS intact.

## 2017-03-13 NOTE — ED Notes (Signed)
Ortho paged. 

## 2017-03-13 NOTE — ED Notes (Signed)
Patient transported to X-ray 

## 2017-03-13 NOTE — Discharge Instructions (Signed)
Ice. Take [phone] mg of Tylenol every 6 hours. Wear your splint for protection. Follow-up with primary care doctor in 7-10 days if pain is persistent.

## 2017-03-24 ENCOUNTER — Encounter (HOSPITAL_COMMUNITY): Payer: Self-pay | Admitting: *Deleted

## 2017-03-24 ENCOUNTER — Emergency Department (HOSPITAL_COMMUNITY)
Admission: EM | Admit: 2017-03-24 | Discharge: 2017-03-24 | Disposition: A | Discharge status: Home / Self Care | Payer: Medicaid Other | Attending: Emergency Medicine | Admitting: Emergency Medicine

## 2017-03-24 ENCOUNTER — Other Ambulatory Visit: Payer: Self-pay

## 2017-03-24 DIAGNOSIS — Y92018 Other place in single-family (private) house as the place of occurrence of the external cause: Secondary | ICD-10-CM | POA: Insufficient documentation

## 2017-03-24 DIAGNOSIS — Z7722 Contact with and (suspected) exposure to environmental tobacco smoke (acute) (chronic): Secondary | ICD-10-CM | POA: Insufficient documentation

## 2017-03-24 DIAGNOSIS — M795 Residual foreign body in soft tissue: Secondary | ICD-10-CM

## 2017-03-24 DIAGNOSIS — X58XXXA Exposure to other specified factors, initial encounter: Secondary | ICD-10-CM | POA: Insufficient documentation

## 2017-03-24 DIAGNOSIS — S00551A Superficial foreign body of lip, initial encounter: Secondary | ICD-10-CM | POA: Insufficient documentation

## 2017-03-24 DIAGNOSIS — Y999 Unspecified external cause status: Secondary | ICD-10-CM | POA: Insufficient documentation

## 2017-03-24 DIAGNOSIS — Y9389 Activity, other specified: Secondary | ICD-10-CM | POA: Insufficient documentation

## 2017-03-24 MED ORDER — LIDOCAINE-EPINEPHRINE 1 %-1:100000 IJ SOLN
20.0000 mL | Freq: Once | INTRAMUSCULAR | Status: AC
  Administered 2017-03-24: 20 mL via INTRADERMAL
  Filled 2017-03-24: qty 20

## 2017-03-24 NOTE — ED Triage Notes (Signed)
Pt states the lip ring in her lower lip is submerged in her lower lip.

## 2017-03-24 NOTE — ED Provider Notes (Signed)
Emergency Department Provider Note   I have reviewed the triage vital signs and the nursing notes.   HISTORY  Chief Complaint Foreign Body in Skin (Lip ring )   HPI Gabriela Jones is a 22 y.o. female without significant past medical history the presents the emerge department today secondary to foreign body in her lip.  She had a lip ring in place and she took off the out to her part of it and the backing seems to have gotten stuck in her lip and she cannot get it out.  This is been there for couple days without any resolution.  She has some pain around it but no other issues. No other associated or modifying symptoms.    History reviewed. No pertinent past medical history.  Patient Active Problem List   Diagnosis Date Noted  . Acne vulgaris 03/30/2016    Past Surgical History:  Procedure Laterality Date  . TONSILLECTOMY      Current Outpatient Rx  . Order #: [id number] Class: Normal  . Order #: [id number] Class: Normal  . Order #: [id number] Class: Print    Allergies Patient has no known allergies.  Family History  Problem Relation Age of Onset  . Hypertension Father     Social History Social History   Tobacco Use  . Smoking status: Passive Smoke Exposure - Never Smoker  . Smokeless tobacco: Never Used  Substance Use Topics  . Alcohol use: No  . Drug use: No    Review of Systems  All other systems negative except as documented in the HPI. All pertinent positives and negatives as reviewed in the HPI. ____________________________________________   PHYSICAL EXAM:  VITAL SIGNS: ED Triage Vitals  Enc Vitals Group     BP 03/24/17 1354 122/69     Pulse Rate 03/24/17 1354 66     Resp 03/24/17 1354 15     Temp 03/24/17 1354 98.6 F (37 C)     Temp Source 03/24/17 1354 Oral     SpO2 03/24/17 1354 100 %     Weight 03/24/17 1354 188 lb (85.3 kg)     Height 03/24/17 1354 5\' 10"  (1.778 m)    Constitutional: Alert and oriented. Well appearing and in no acute  distress. Eyes: Conjunctivae are normal. PERRL. EOMI. Head: Atraumatic. Nose: No congestion/rhinnorhea. Mouth/Throat: Mucous membranes are moist.  Oropharynx non-erythematous. Foreign body palpated in lower lip. Neck: No stridor.  No meningeal signs.   Cardiovascular: Normal rate, regular rhythm. Good peripheral circulation. Grossly normal heart sounds.   Respiratory: Normal respiratory effort.  No retractions. Lungs CTAB. Gastrointestinal: Soft and nontender. No distention.  Musculoskeletal: No lower extremity tenderness nor edema. No gross deformities of extremities. Neurologic:  Normal speech and language. No gross focal neurologic deficits are appreciated.  Skin:  Skin is warm, dry and intact. No rash noted.  ____________________________________________   PROCEDURES  Procedure(s) performed:   .Foreign Body Removal Date/Time: 03/24/2017 4:44 PM Performed by: Marily Memos, MD Authorized by: Marily Memos, MD  Consent: Verbal consent obtained. Risks and benefits: risks, benefits and alternatives were discussed Consent given by: patient Patient understanding: patient states understanding of the procedure being performed Patient consent: the patient's understanding of the procedure matches consent given Procedure consent: procedure consent matches procedure scheduled Required items: required blood products, implants, devices, and special equipment available Patient identity confirmed: verbally with patient and arm band Time out: Immediately prior to procedure a "time out" was called to verify the correct patient, procedure, equipment, support staff and  site/side marked as required. Intake: lower lip. Anesthesia: local infiltration  Anesthesia: Local Anesthetic: lidocaine 1% with epinephrine Anesthetic total: 3 mL  Sedation: Patient sedated: no  Patient restrained: no Complexity: simple 1 objects recovered. Objects recovered: metal object with flat circular piece on one  end approximately 3mm wide and a longer rod piece approximately 5mm long with threads Post-procedure assessment: foreign body removed Patient tolerance: Patient tolerated the procedure well with no immediate complications     ____________________________________________   INITIAL IMPRESSION / ASSESSMENT AND PLAN / ED COURSE  Removed what appeared to be a anchor for a lip ring.  No more foreign bodies palpated.  Bleeding minimal.  Patient discharged.  Pertinent labs & imaging results that were available during my care of the patient were reviewed by me and considered in my medical decision making (see chart for details).  ____________________________________________  FINAL CLINICAL IMPRESSION(S) / ED DIAGNOSES  Final diagnoses:  Foreign body (FB) in soft tissue     MEDICATIONS GIVEN DURING THIS VISIT:  Medications  lidocaine-EPINEPHrine (XYLOCAINE W/EPI) 1 %-1:100000 (with pres) injection 20 mL (20 mLs Intradermal Given 03/24/17 1552)     NEW OUTPATIENT MEDICATIONS STARTED DURING THIS VISIT:  New Prescriptions   No medications on file    Note:  This note was prepared with assistance of Dragon voice recognition software. Occasional wrong-word or sound-a-like substitutions may have occurred due to the inherent limitations of voice recognition software.   Marily MemosMesner, Briseis Aguilera, MD 03/24/17 [phone]

## 2017-03-24 NOTE — ED Notes (Signed)
Dr Clayborne DanaMesner removed piece of lip ring from pt's left lower lip. Gauze in place.

## 2017-06-05 ENCOUNTER — Ambulatory Visit (INDEPENDENT_AMBULATORY_CARE_PROVIDER_SITE_OTHER): Payer: Medicaid Other | Admitting: Nurse Practitioner

## 2017-07-04 ENCOUNTER — Other Ambulatory Visit (HOSPITAL_COMMUNITY)
Admission: RE | Admit: 2017-07-04 | Discharge: 2017-07-04 | Disposition: A | Discharge status: Home / Self Care | Payer: Medicaid Other | Source: Ambulatory Visit | Attending: Physician Assistant | Admitting: Physician Assistant

## 2017-07-04 ENCOUNTER — Ambulatory Visit (INDEPENDENT_AMBULATORY_CARE_PROVIDER_SITE_OTHER): Payer: Medicaid Other | Admitting: Physician Assistant

## 2017-07-04 ENCOUNTER — Encounter (INDEPENDENT_AMBULATORY_CARE_PROVIDER_SITE_OTHER): Payer: Self-pay | Admitting: Physician Assistant

## 2017-07-04 ENCOUNTER — Other Ambulatory Visit: Payer: Self-pay

## 2017-07-04 VITALS — BP 120/76 | HR 89 | Temp 97.9°F | Ht 70.0 in | Wt 195.4 lb

## 2017-07-04 DIAGNOSIS — Z118 Encounter for screening for other infectious and parasitic diseases: Secondary | ICD-10-CM | POA: Diagnosis present

## 2017-07-04 DIAGNOSIS — Z0000 Encounter for general adult medical examination without abnormal findings: Secondary | ICD-10-CM

## 2017-07-04 DIAGNOSIS — Z124 Encounter for screening for malignant neoplasm of cervix: Secondary | ICD-10-CM | POA: Insufficient documentation

## 2017-07-04 DIAGNOSIS — Z308 Encounter for other contraceptive management: Secondary | ICD-10-CM

## 2017-07-04 DIAGNOSIS — B9689 Other specified bacterial agents as the cause of diseases classified elsewhere: Secondary | ICD-10-CM | POA: Diagnosis not present

## 2017-07-04 DIAGNOSIS — N760 Acute vaginitis: Secondary | ICD-10-CM | POA: Diagnosis not present

## 2017-07-04 NOTE — Patient Instructions (Signed)

## 2017-07-04 NOTE — Progress Notes (Signed)
Subjective:  Patient ID: Gabriela Jones, female    DOB: 04/10/1995  Age: 22 y.o. MRN: [id number]  CC: annual physical and pap   HPI Gabriela Jones is a 22 y.o. female with a medical history of chlamydial infection presents for an annual physical and Pap. States she is feeling well. No symptoms or complaints whatsoever.       Outpatient Medications Prior to Visit  Medication Sig Dispense Refill  . cetirizine (ZYRTEC) 10 MG tablet Take 1 tablet (10 mg total) by mouth daily. 30 tablet 11  . DIFFERIN 0.1 % cream APPLY TOPICALLY AT BEDTIME TO AFFECTED AREAS OF ACNE AFTER CLEANSING FACE 45 g 2  . ondansetron (ZOFRAN) 4 MG tablet Take 1 tablet (4 mg total) by mouth every 6 (six) hours. 12 tablet 0   No facility-administered medications prior to visit.      ROS Review of Systems  Constitutional: Negative for chills, fever and malaise/fatigue.  Eyes: Negative for blurred vision.  Respiratory: Negative for shortness of breath.   Cardiovascular: Negative for chest pain and palpitations.  Gastrointestinal: Negative for abdominal pain and nausea.  Genitourinary: Negative for dysuria and hematuria.  Musculoskeletal: Negative for joint pain and myalgias.  Skin: Negative for rash.  Neurological: Negative for tingling and headaches.  Psychiatric/Behavioral: Negative for depression. The patient is not nervous/anxious.     Objective:  BP 120/76 (BP Location: Left Arm, Patient Position: Sitting, Cuff Size: Normal)   Pulse 89   Temp 97.9 F (36.6 C) (Oral)   Ht 5\' 10"  (1.778 m)   Wt 195 lb 6.4 oz (88.6 kg)   LMP 06/26/2017 (Approximate)   SpO2 95%   BMI 28.04 kg/m   BP/Weight 07/04/2017 03/24/2017 03/13/2017  Systolic BP 120 122 125  Diastolic BP 76 69 82  Wt. (Lbs) 195.4 188 190  BMI 28.04 26.98 27.26      Physical Exam  Constitutional: She is oriented to person, place, and time.  Well developed, well nourished, NAD, polite  HENT:  Head: Normocephalic and atraumatic.   Mouth/Throat: No oropharyngeal exudate.  Eyes: Pupils are equal, round, and reactive to light. Conjunctivae and EOM are normal. No scleral icterus.  Neck: Normal range of motion. Neck supple. No thyromegaly present.  Cardiovascular: Normal rate, regular rhythm, normal heart sounds and intact distal pulses.  No murmur heard. Pulmonary/Chest: Effort normal and breath sounds normal.  Abdominal: Soft. Bowel sounds are normal. She exhibits no distension and no mass. There is no tenderness. There is no rebound and no guarding.  Genitourinary:  Genitourinary Comments: Vulva normal. White vaginal discharge, no odor. Cervix normal, no cervical motion tenderness. No adnexal tenderness or mass bilaterally. No uterine tenderness or mass.   Musculoskeletal: She exhibits no edema or deformity.  Full aROM of UEs, LEs, and back.  Neurological: She is alert and oriented to person, place, and time. No cranial nerve deficit.  Normal gait. Patellar DTR 2+ bilaterally  Skin: Skin is warm and dry. No rash noted. No erythema. No pallor.  Psychiatric: She has a normal mood and affect. Her behavior is normal. Thought content normal.  Vitals reviewed.    Assessment & Plan:    1. Screening for chlamydial disease - Cytology - PAP([place])  2. Screening for cervical cancer - Cytology - PAP()  3. Annual physical exam - CBC with Differential - Comprehensive metabolic panel     Follow-up: 1 year annual physical  Loletta SpecterRoger David Mariann Palo PA

## 2017-07-05 ENCOUNTER — Telehealth: Payer: Self-pay

## 2017-07-05 LAB — COMPREHENSIVE METABOLIC PANEL
A/G RATIO: 1.7 (ref 1.2–2.2)
ALBUMIN: 4.6 g/dL (ref 3.5–5.5)
ALT: 7 IU/L (ref 0–32)
AST: 9 IU/L (ref 0–40)
Alkaline Phosphatase: 53 IU/L (ref 39–117)
BUN / CREAT RATIO: 8 — AB (ref 9–23)
BUN: 7 mg/dL (ref 6–20)
Bilirubin Total: 0.2 mg/dL (ref 0.0–1.2)
CO2: 22 mmol/L (ref 20–29)
CREATININE: 0.86 mg/dL (ref 0.57–1.00)
Calcium: 9.6 mg/dL (ref 8.7–10.2)
Chloride: 102 mmol/L (ref 96–106)
GFR calc non Af Amer: 97 mL/min/{1.73_m2} (ref >59)
GFR, EST AFRICAN AMERICAN: 112 mL/min/{1.73_m2} (ref >59)
GLOBULIN, TOTAL: 2.7 g/dL (ref 1.5–4.5)
Glucose: 88 mg/dL (ref 65–99)
POTASSIUM: 4.2 mmol/L (ref 3.5–5.2)
SODIUM: 139 mmol/L (ref 134–144)
Total Protein: 7.3 g/dL (ref 6.0–8.5)

## 2017-07-05 LAB — CBC WITH DIFFERENTIAL/PLATELET
BASOS: 1 %
Basophils Absolute: 0 10*3/uL (ref 0.0–0.2)
EOS (ABSOLUTE): 0.1 10*3/uL (ref 0.0–0.4)
EOS: 1 %
HEMATOCRIT: 38.4 % (ref 34.0–46.6)
HEMOGLOBIN: 12.1 g/dL (ref 11.1–15.9)
IMMATURE GRANULOCYTES: 0 %
Immature Grans (Abs): 0 10*3/uL (ref 0.0–0.1)
LYMPHS ABS: 1.7 10*3/uL (ref 0.7–3.1)
Lymphs: 39 %
MCH: 28.1 pg (ref 26.6–33.0)
MCHC: 31.5 g/dL (ref 31.5–35.7)
MCV: 89 fL (ref 79–97)
MONOCYTES: 5 %
MONOS ABS: 0.2 10*3/uL (ref 0.1–0.9)
NEUTROS PCT: 54 %
Neutrophils Absolute: 2.4 10*3/uL (ref 1.4–7.0)
Platelets: 199 10*3/uL (ref 150–450)
RBC: 4.31 x10E6/uL (ref 3.77–5.28)
RDW: 13.6 % (ref 12.3–15.4)
WBC: 4.4 10*3/uL (ref 3.4–10.8)

## 2017-07-05 NOTE — Telephone Encounter (Signed)
Left voicemail message informing patient of normal blood work. Still awaiting pap results and will call once those results are available. Call [facility] with any questions or concerns. Maryjean Mornempestt S Dunya Meiners, CMA

## 2017-07-05 NOTE — Telephone Encounter (Signed)
-----   Message from Loletta Specteroger David Gomez, PA-C sent at 07/05/2017  8:44 AM EDT ----- Normal labs.

## 2017-07-08 ENCOUNTER — Other Ambulatory Visit (INDEPENDENT_AMBULATORY_CARE_PROVIDER_SITE_OTHER): Payer: Self-pay | Admitting: Physician Assistant

## 2017-07-08 DIAGNOSIS — B9689 Other specified bacterial agents as the cause of diseases classified elsewhere: Secondary | ICD-10-CM

## 2017-07-08 DIAGNOSIS — N760 Acute vaginitis: Principal | ICD-10-CM

## 2017-07-08 LAB — CYTOLOGY - PAP
BACTERIAL VAGINITIS: POSITIVE — AB
CANDIDA VAGINITIS: NEGATIVE
Chlamydia: NEGATIVE
Diagnosis: NEGATIVE
NEISSERIA GONORRHEA: NEGATIVE
TRICH (WINDOWPATH): NEGATIVE

## 2017-07-08 MED ORDER — METRONIDAZOLE 500 MG PO TABS: 500.0000 mg | ORAL_TABLET | Freq: Two times a day (BID) | ORAL | 0 refills | Status: AC

## 2017-07-12 ENCOUNTER — Telehealth (INDEPENDENT_AMBULATORY_CARE_PROVIDER_SITE_OTHER): Payer: Self-pay

## 2017-07-12 NOTE — Telephone Encounter (Signed)
Left detailed message on patient voicemail informing that pap was negative for malignancy but positive for bacterial vaginosis. [facility] [address] Call [facility] with any questions or concerns. Maryjean Mornempestt S Destine Ambroise, CMA

## 2017-07-12 NOTE — Telephone Encounter (Signed)
-----   Message from Loletta Specteroger David Gomez, PA-C sent at 07/08/2017 12:24 PM EDT ----- Pap negative for malignancy. Positive for BV. I have sent out metronidazole to [facility] at [address].

## 2017-09-13 ENCOUNTER — Other Ambulatory Visit: Payer: Self-pay

## 2017-09-13 ENCOUNTER — Encounter (HOSPITAL_COMMUNITY): Payer: Self-pay | Admitting: Emergency Medicine

## 2017-09-13 ENCOUNTER — Emergency Department (HOSPITAL_COMMUNITY)
Admission: EM | Admit: 2017-09-13 | Discharge: 2017-09-13 | Disposition: A | Discharge status: Home / Self Care | Payer: Medicaid Other | Attending: Emergency Medicine | Admitting: Emergency Medicine

## 2017-09-13 DIAGNOSIS — Z5321 Procedure and treatment not carried out due to patient leaving prior to being seen by health care provider: Secondary | ICD-10-CM | POA: Insufficient documentation

## 2017-09-13 DIAGNOSIS — O219 Vomiting of pregnancy, unspecified: Secondary | ICD-10-CM | POA: Diagnosis not present

## 2017-09-13 DIAGNOSIS — Z3A00 Weeks of gestation of pregnancy not specified: Secondary | ICD-10-CM | POA: Diagnosis not present

## 2017-09-13 LAB — CBC WITH DIFFERENTIAL/PLATELET
Abs Immature Granulocytes: 0 10*3/uL (ref 0.0–0.1)
Basophils Absolute: 0.1 10*3/uL (ref 0.0–0.1)
Basophils Relative: 1 %
EOS ABS: 0 10*3/uL (ref 0.0–0.7)
EOS PCT: 1 %
HEMATOCRIT: 38.9 % (ref 36.0–46.0)
Hemoglobin: 12.3 g/dL (ref 12.0–15.0)
Immature Granulocytes: 0 %
LYMPHS ABS: 1.8 10*3/uL (ref 0.7–4.0)
LYMPHS PCT: 27 %
MCH: 28.5 pg (ref 26.0–34.0)
MCHC: 31.6 g/dL (ref 30.0–36.0)
MCV: 90 fL (ref 78.0–100.0)
MONO ABS: 0.4 10*3/uL (ref 0.1–1.0)
Monocytes Relative: 6 %
Neutro Abs: 4.4 10*3/uL (ref 1.7–7.7)
Neutrophils Relative %: 65 %
PLATELETS: 199 10*3/uL (ref 150–400)
RBC: 4.32 MIL/uL (ref 3.87–5.11)
RDW: 12.7 % (ref 11.5–15.5)
WBC: 6.7 10*3/uL (ref 4.0–10.5)

## 2017-09-13 LAB — HCG, QUANTITATIVE, PREGNANCY: hCG, Beta Chain, Quant, S: 215500 m[IU]/mL — ABNORMAL HIGH (ref <5)

## 2017-09-13 LAB — COMPREHENSIVE METABOLIC PANEL
ALK PHOS: 38 U/L (ref 38–126)
ALT: 8 U/L (ref 0–44)
AST: 10 U/L — AB (ref 15–41)
Albumin: 4.1 g/dL (ref 3.5–5.0)
Anion gap: 10 (ref 5–15)
BUN: 5 mg/dL — AB (ref 6–20)
CALCIUM: 9.4 mg/dL (ref 8.9–10.3)
CHLORIDE: 103 mmol/L (ref 98–111)
CO2: 22 mmol/L (ref 22–32)
CREATININE: 0.73 mg/dL (ref 0.44–1.00)
GFR calc Af Amer: >60 mL/min (ref >60)
GFR calc non Af Amer: >60 mL/min (ref >60)
Glucose, Bld: 86 mg/dL (ref 70–99)
Potassium: 3.9 mmol/L (ref 3.5–5.1)
Sodium: 135 mmol/L (ref 135–145)
Total Bilirubin: 0.5 mg/dL (ref 0.3–1.2)
Total Protein: 7 g/dL (ref 6.5–8.1)

## 2017-09-13 LAB — LIPASE, BLOOD: Lipase: 40 U/L (ref 11–51)

## 2017-09-13 NOTE — ED Triage Notes (Signed)
Pt presents with vomiting x 1 wk; LMP in June; took urine preg test that was positive; no prenatal care; G1P0 ; pt denies vaginal issues, denies pain, denies urinary symptoms

## 2017-09-13 NOTE — ED Notes (Signed)
Updated on wait for treatment room. 

## 2017-09-13 NOTE — ED Notes (Signed)
Pt states she is leaving.  Encouraged pt to stay and she declined.

## 2017-09-13 NOTE — ED Provider Notes (Cosign Needed)
Patient placed in Quick Look pathway, seen and evaluated   Chief Complaint: N/V in pregnancy  HPI:   Patient has been nauseous and vomiting.  She had her last menstrual period in June (middle).  She had a positive urine pregnancy test.  No spotting or bleeding.  No pelvic or vaginal pain.    ROS: No fevers.   Physical Exam:   Gen: No distress  Neuro: Awake and Alert  Skin: Warm    Focused Exam: Not actively vomiting during triage.    Initiation of care has begun. The patient has been counseled on the process, plan, and necessity for staying for the completion/evaluation, and the remainder of the medical screening examination    Cristina Gong, Cordelia Poche 09/13/17 1710

## 2017-09-14 ENCOUNTER — Other Ambulatory Visit: Payer: Self-pay

## 2017-09-14 ENCOUNTER — Emergency Department (HOSPITAL_COMMUNITY)
Admission: EM | Admit: 2017-09-14 | Discharge: 2017-09-14 | Disposition: A | Discharge status: Home / Self Care | Payer: Medicaid Other | Attending: Emergency Medicine | Admitting: Emergency Medicine

## 2017-09-14 ENCOUNTER — Encounter (HOSPITAL_COMMUNITY): Payer: Self-pay | Admitting: *Deleted

## 2017-09-14 DIAGNOSIS — O219 Vomiting of pregnancy, unspecified: Secondary | ICD-10-CM | POA: Diagnosis not present

## 2017-09-14 DIAGNOSIS — Z3A08 8 weeks gestation of pregnancy: Secondary | ICD-10-CM | POA: Diagnosis not present

## 2017-09-14 DIAGNOSIS — Z7722 Contact with and (suspected) exposure to environmental tobacco smoke (acute) (chronic): Secondary | ICD-10-CM | POA: Insufficient documentation

## 2017-09-14 DIAGNOSIS — Z79899 Other long term (current) drug therapy: Secondary | ICD-10-CM | POA: Insufficient documentation

## 2017-09-14 LAB — CBC WITH DIFFERENTIAL/PLATELET
Abs Immature Granulocytes: 0 10*3/uL (ref 0.0–0.1)
Basophils Absolute: 0 10*3/uL (ref 0.0–0.1)
Basophils Relative: 1 %
Eosinophils Absolute: 0 10*3/uL (ref 0.0–0.7)
Eosinophils Relative: 0 %
HCT: 35 % — ABNORMAL LOW (ref 36.0–46.0)
Hemoglobin: 11.3 g/dL — ABNORMAL LOW (ref 12.0–15.0)
Immature Granulocytes: 0 %
Lymphocytes Relative: 21 %
Lymphs Abs: 1.6 10*3/uL (ref 0.7–4.0)
MCH: 29 pg (ref 26.0–34.0)
MCHC: 32.3 g/dL (ref 30.0–36.0)
MCV: 90 fL (ref 78.0–100.0)
Monocytes Absolute: 0.3 10*3/uL (ref 0.1–1.0)
Monocytes Relative: 4 %
Neutro Abs: 5.9 10*3/uL (ref 1.7–7.7)
Neutrophils Relative %: 74 %
Platelets: 179 10*3/uL (ref 150–400)
RBC: 3.89 MIL/uL (ref 3.87–5.11)
RDW: 12.7 % (ref 11.5–15.5)
WBC: 7.9 10*3/uL (ref 4.0–10.5)

## 2017-09-14 LAB — URINALYSIS, ROUTINE W REFLEX MICROSCOPIC
Bilirubin Urine: NEGATIVE
Glucose, UA: NEGATIVE mg/dL
Hgb urine dipstick: NEGATIVE
Ketones, ur: 80 mg/dL — AB
Leukocytes, UA: NEGATIVE
Nitrite: NEGATIVE
Protein, ur: 30 mg/dL — AB
Specific Gravity, Urine: 1.033 — ABNORMAL HIGH (ref 1.005–1.030)
pH: 5 (ref 5.0–8.0)

## 2017-09-14 LAB — BASIC METABOLIC PANEL
Anion gap: 8 (ref 5–15)
BUN: 8 mg/dL (ref 6–20)
CO2: 23 mmol/L (ref 22–32)
Calcium: 9.2 mg/dL (ref 8.9–10.3)
Chloride: 103 mmol/L (ref 98–111)
Creatinine, Ser: 0.7 mg/dL (ref 0.44–1.00)
GFR calc Af Amer: >60 mL/min (ref >60)
GFR calc non Af Amer: >60 mL/min (ref >60)
Glucose, Bld: 74 mg/dL (ref 70–99)
Potassium: 3.6 mmol/L (ref 3.5–5.1)
Sodium: 134 mmol/L — ABNORMAL LOW (ref 135–145)

## 2017-09-14 MED ORDER — METOCLOPRAMIDE HCL 5 MG/ML IJ SOLN
10.0000 mg | Freq: Once | INTRAMUSCULAR | Status: AC
  Administered 2017-09-14: 10 mg via INTRAVENOUS
  Filled 2017-09-14: qty 2

## 2017-09-14 MED ORDER — SODIUM CHLORIDE 0.9 % IV BOLUS
1000.0000 mL | Freq: Once | INTRAVENOUS | Status: AC
  Administered 2017-09-14: 1000 mL via INTRAVENOUS

## 2017-09-14 MED ORDER — ONDANSETRON 4 MG PO TBDP
4.0000 mg | ORAL_TABLET | Freq: Once | ORAL | Status: AC
  Administered 2017-09-14: 4 mg via ORAL
  Filled 2017-09-14: qty 1

## 2017-09-14 MED ORDER — ONDANSETRON 4 MG PO TBDP: 4.0000 mg | ORAL_TABLET | Freq: Three times a day (TID) | ORAL | 0 refills | Status: DC | PRN

## 2017-09-14 MED ORDER — DOXYLAMINE-PYRIDOXINE 10-10 MG PO TBEC: DELAYED_RELEASE_TABLET | ORAL | 0 refills | Status: DC

## 2017-09-14 NOTE — Discharge Instructions (Addendum)
1. Medications: Start taking Diclegis as prescribed as needed for nausea and vomiting.  If the medication is expensive, you can buy the individual components (doxylamine and pyridoxine which is also known as vitamin B6) over-the-counter and take 2 pills instead of 1.  If the Ticlid just is not working you can take Zofran as needed for nausea.  Let the medication dissolve under your tongue.  Wait around 20 minutes before eating or drinking after taking this medication. 2. Treatment: rest, drink plenty of fluids, advance diet slowly.  Make sure that you are eating small meals frequently throughout the day every 2-3 hours to make sure that your stomach is not too full or to empty.  You may find it helpful to eat ginger products such as ginger ale or chew on ginger root.  Avoid fried foods, fatty foods, acidic foods, or spicy foods that may make her symptoms worse.  Bland foods can be helpful. 3. Follow Up: Please followup with your primary doctor in 3 days for discussion of your diagnoses and further evaluation after today's visit; follow-up with OB/GYN to obtain confirmation ultrasound and for follow-up; Please return to the ER for persistent vomiting, high fevers or worsening symptoms

## 2017-09-14 NOTE — ED Notes (Signed)
Called lab and asked to have culture added, informed to send rec. Form down and it should be added.

## 2017-09-14 NOTE — ED Notes (Signed)
Pt to attempt PO challenge again with saltines and gingerale. Will continue to monitor.

## 2017-09-14 NOTE — ED Triage Notes (Signed)
The pt is [redacted] weeks pregnant and for 8 weeks she has been vomiting  She has not seen a ob-gyn doctor yet lmp July 9th

## 2017-09-14 NOTE — ED Notes (Signed)
Pt given gingerale & saltines for PO challenge. Pt tolerating well. Will continue to monitor.

## 2017-09-14 NOTE — ED Notes (Signed)
Pt verbalizes understanding of d/c instructions. Prescriptions reviewed with patient. Pt ambulatory at d/c with all belongings and with family.   

## 2017-09-14 NOTE — ED Provider Notes (Signed)
[name] EMERGENCY DEPARTMENT Provider Note   CSN: [id number] Arrival date & time: 09/14/17  1744     History   Chief Complaint Chief Complaint  Patient presents with  . Emesis    HPI Gabriela Jones is a 22 y.o. female G1, P21 female presenting for evaluation of acute onset, persisting nausea and vomiting for 4 weeks.  She states she thinks that she is approximately [redacted] weeks pregnant.  Last menstrual period July 9.  She has not followed up with OB/GYN yet.  She notes that for the past 4 weeks or so she has not been able to keep anything down and notes approximately 4 episodes of nonbloody nonbilious emesis daily.  No aggravating or alleviating factors noted.  Has not tried anything for her symptoms.  Denies fevers, abdominal pain, urinary symptoms, vaginal itching, bleeding, discharge, back pain, chest pain, or shortness of breath.  Came to the ED yesterday and had labs drawn but left prior to provider assessment due to long wait times.  The history is provided by the patient.    History reviewed. No pertinent past medical history.  Patient Active Problem List   Diagnosis Date Noted  . Acne vulgaris 03/30/2016    Past Surgical History:  Procedure Laterality Date  . TONSILLECTOMY       OB History    Gravida  1   Para      Term      Preterm      AB      Living        SAB      TAB      Ectopic      Multiple      Live Births               Home Medications    Prior to Admission medications   Medication Sig Start Date End Date Taking? Authorizing Provider  cetirizine (ZYRTEC) 10 MG tablet Take 1 tablet (10 mg total) by mouth daily. 02/05/17   Loletta Specter, PA-C  DIFFERIN 0.1 % cream APPLY TOPICALLY AT BEDTIME TO AFFECTED AREAS OF ACNE AFTER CLEANSING FACE 03/30/16   Maree Erie, MD  Doxylamine-Pyridoxine 10-10 MG TBEC Two tablets at bedtime on day 1 and 2; if symptoms persist, take 1 tablet in morning and 2 tablets at bedtime  on day 3; if symptoms persist, may increase to 1 tablet in morning, 1 tablet mid-afternoon, and 2 tablets at bedtime on day 4 09/14/17   Michela Pitcher A, PA-C  ondansetron (ZOFRAN ODT) 4 MG disintegrating tablet Take 1 tablet (4 mg total) by mouth every 8 (eight) hours as needed for nausea or vomiting. 09/14/17   Jeanie Sewer, PA-C    Family History Family History  Problem Relation Age of Onset  . Hypertension Father     Social History Social History   Tobacco Use  . Smoking status: Passive Smoke Exposure - Never Smoker  . Smokeless tobacco: Never Used  Substance Use Topics  . Alcohol use: No  . Drug use: No     Allergies   Patient has no known allergies.   Review of Systems Review of Systems  Constitutional: Negative for chills and fever.  Respiratory: Negative for shortness of breath.   Cardiovascular: Negative for chest pain.  Gastrointestinal: Positive for nausea and vomiting. Negative for abdominal pain.  Genitourinary: Negative for dysuria, hematuria, vaginal bleeding, vaginal discharge and vaginal pain.  Musculoskeletal: Negative for back pain.  All other systems reviewed and are negative.    Physical Exam Updated Vital Signs BP 103/64   Pulse 95   Temp 98.5 F (36.9 C) (Oral)   Resp 14   Ht 5\' 11"  (1.803 m)   Wt 86.2 kg   LMP 07/16/2017   SpO2 100%   BMI 26.50 kg/m   Physical Exam  Constitutional: She appears well-developed and well-nourished. No distress.  HENT:  Head: Normocephalic and atraumatic.  Moist mucous membranes  Eyes: Conjunctivae are normal. Right eye exhibits no discharge. Left eye exhibits no discharge.  Neck: No JVD present. No tracheal deviation present.  Cardiovascular: Normal rate, regular rhythm and normal heart sounds.  Pulmonary/Chest: Effort normal and breath sounds normal.  Abdominal: Soft. Bowel sounds are normal. She exhibits no distension. There is no tenderness. There is no rigidity, no rebound, no guarding and no CVA  tenderness.  Musculoskeletal: Normal range of motion. She exhibits no edema.  Neurological: She is alert.  Skin: Skin is warm and dry. No erythema.  Psychiatric: She has a normal mood and affect. Her behavior is normal.  Nursing note and vitals reviewed.    ED Treatments / Results  Labs (all labs ordered are listed, but only abnormal results are displayed) Labs Reviewed  URINALYSIS, ROUTINE W REFLEX MICROSCOPIC - Abnormal; Notable for the following components:      Result Value   Specific Gravity, Urine 1.033 (*)    Ketones, ur 80 (*)    Protein, ur 30 (*)    Bacteria, UA RARE (*)    All other components within normal limits  BASIC METABOLIC PANEL - Abnormal; Notable for the following components:   Sodium 134 (*)    All other components within normal limits  CBC WITH DIFFERENTIAL/PLATELET - Abnormal; Notable for the following components:   Hemoglobin 11.3 (*)    HCT 35.0 (*)    All other components within normal limits  URINE CULTURE    EKG None  Radiology No results found.  Procedures Procedures (including critical care time)  Medications Ordered in ED Medications  ondansetron (ZOFRAN-ODT) disintegrating tablet 4 mg (4 mg Oral Given 09/14/17 1941)  sodium chloride 0.9 % bolus 1,000 mL (0 mLs Intravenous Stopped 09/14/17 2137)  metoCLOPramide (REGLAN) injection 10 mg (10 mg Intravenous Given 09/14/17 2045)     Initial Impression / Assessment and Plan / ED Course  I have reviewed the triage vital signs and the nursing notes.  Pertinent labs & imaging results that were available during my care of the patient were reviewed by me and considered in my medical decision making (see chart for details).     Patient with a one-month history of nausea and vomiting in the setting of first trimester pregnancy.  She is afebrile, vital signs are stable.  She is nontoxic in appearance.  Abdomen is soft and nontender.  She has not had confirmatory ultrasound however I have a low  suspicion of ectopic pregnancy in the absence of any significant pain and with normal vital signs.  She had lab work done yesterday which shows quantitative hCG consistent with estimated gestational age.  Remainder of lab work shows no leukocytosis, mild anemia.  No significant metabolic derangements, LFTs, lipase, and creatinine within normal limits.  UA today suggestive of dehydration.  She has rare bacteria but appears urine sample was not a clean-catch with some squamous epithelial cells present.  She has no pyuria, WBCs, or RBCs on UA and she has no urinary symptoms.  Will  culture.  She was given IV fluids and Zofran but initially did not tolerate p.o. She was then given Reglan and on reevaluation is tolerating p.o. food and fluids without difficulty.  Doubt obstruction, perforation, appendicitis, colitis, TOA, ovarian torsion, ectopic emergency, PID, or other acute surgical abdominal pathology in the absence of abdominal pain.  Discussed dietary modifications for hyperemesis in pregnancy.  Will discharge with prescription for Diclegis with back-up prescription of Zofran if the Diclegis is not effective.  Recommend follow-up with OB/GYN for reevaluation.  Discussed strict ED return precautions. Pt verbalized understanding of and agreement with plan and is safe for discharge home at this time.  Final Clinical Impressions(s) / ED Diagnoses   Final diagnoses:  Nausea and vomiting in pregnancy    ED Discharge Orders         Ordered    Doxylamine-Pyridoxine 10-10 MG TBEC     09/14/17 2217    ondansetron (ZOFRAN ODT) 4 MG disintegrating tablet  Every 8 hours PRN     09/14/17 2217           Jeanie Sewer, PA-C 09/15/17 0157    Margarita Grizzle, MD 09/16/17 2123

## 2017-09-14 NOTE — ED Triage Notes (Signed)
Pt not actively vomiting at preent

## 2017-09-16 LAB — OB RESULTS CONSOLE HIV ANTIBODY (ROUTINE TESTING): HIV: NONREACTIVE

## 2017-09-16 LAB — URINE CULTURE

## 2017-09-16 LAB — OB RESULTS CONSOLE HEPATITIS B SURFACE ANTIGEN: Hepatitis B Surface Ag: NEGATIVE

## 2017-09-16 LAB — OB RESULTS CONSOLE RUBELLA ANTIBODY, IGM: Rubella: IMMUNE

## 2017-09-16 LAB — OB RESULTS CONSOLE RPR: RPR: NONREACTIVE

## 2017-09-16 LAB — OB RESULTS CONSOLE ANTIBODY SCREEN: Antibody Screen: NEGATIVE

## 2017-09-16 LAB — OB RESULTS CONSOLE ABO/RH: RH Type: POSITIVE

## 2017-09-16 LAB — OB RESULTS CONSOLE GC/CHLAMYDIA
Chlamydia: NEGATIVE
Gonorrhea: NEGATIVE

## 2017-11-06 ENCOUNTER — Encounter (HOSPITAL_COMMUNITY): Payer: Self-pay | Admitting: Emergency Medicine

## 2017-11-06 ENCOUNTER — Emergency Department (HOSPITAL_COMMUNITY)
Admission: EM | Admit: 2017-11-06 | Discharge: 2017-11-06 | Disposition: A | Discharge status: Home / Self Care | Payer: Medicaid Other | Attending: Emergency Medicine | Admitting: Emergency Medicine

## 2017-11-06 DIAGNOSIS — O9989 Other specified diseases and conditions complicating pregnancy, childbirth and the puerperium: Secondary | ICD-10-CM | POA: Insufficient documentation

## 2017-11-06 DIAGNOSIS — J069 Acute upper respiratory infection, unspecified: Secondary | ICD-10-CM | POA: Diagnosis not present

## 2017-11-06 DIAGNOSIS — B9789 Other viral agents as the cause of diseases classified elsewhere: Secondary | ICD-10-CM

## 2017-11-06 DIAGNOSIS — Z7722 Contact with and (suspected) exposure to environmental tobacco smoke (acute) (chronic): Secondary | ICD-10-CM | POA: Diagnosis not present

## 2017-11-06 DIAGNOSIS — R05 Cough: Secondary | ICD-10-CM | POA: Insufficient documentation

## 2017-11-06 DIAGNOSIS — Z79899 Other long term (current) drug therapy: Secondary | ICD-10-CM | POA: Diagnosis not present

## 2017-11-06 DIAGNOSIS — Z3A15 15 weeks gestation of pregnancy: Secondary | ICD-10-CM | POA: Diagnosis not present

## 2017-11-06 NOTE — Discharge Instructions (Signed)
You appear to have an upper respiratory infection (URI). An upper respiratory tract infection, or cold, is a viral infection of the air passages leading to the lungs. It is contagious and can be spread to others, especially during the first 3 or 4 days. It cannot be cured by antibiotics or other medicines. °RETURN IMMEDIATELY IF you develop shortness of breath, confusion or altered mental status, a new rash, become dizzy, faint, or poorly responsive, or are unable to be cared for at home. ° °

## 2017-11-06 NOTE — ED Provider Notes (Signed)
[name] EMERGENCY DEPARTMENT Provider Note   CSN: [id number] Arrival date & time: 11/06/17  1419     History   Chief Complaint Chief Complaint  Patient presents with  . URI    HPI Gabriela Jones is a 22 y.o. G1, P0 female who is currently [redacted] weeks pregnant who presents the emergency department with symptoms of URI.  Had 3 days of nasal discharge, sneezing, sore throat and she has had no cough no myalgias no fever no chills no weight loss, no nausea no vomiting she denies vaginal bleeding or abdominal cramping.  She denies urinary symptoms.  She has not taken anything for her symptoms.  HPI  History reviewed. No pertinent past medical history.  Patient Active Problem List   Diagnosis Date Noted  . Acne vulgaris 03/30/2016    Past Surgical History:  Procedure Laterality Date  . TONSILLECTOMY       OB History    Gravida  1   Para      Term      Preterm      AB      Living        SAB      TAB      Ectopic      Multiple      Live Births               Home Medications    Prior to Admission medications   Medication Sig Start Date End Date Taking? Authorizing Provider  cetirizine (ZYRTEC) 10 MG tablet Take 1 tablet (10 mg total) by mouth daily. 02/05/17   Loletta Specter, PA-C  DIFFERIN 0.1 % cream APPLY TOPICALLY AT BEDTIME TO AFFECTED AREAS OF ACNE AFTER CLEANSING FACE 03/30/16   Maree Erie, MD  Doxylamine-Pyridoxine 10-10 MG TBEC Two tablets at bedtime on day 1 and 2; if symptoms persist, take 1 tablet in morning and 2 tablets at bedtime on day 3; if symptoms persist, may increase to 1 tablet in morning, 1 tablet mid-afternoon, and 2 tablets at bedtime on day 4 09/14/17   Michela Pitcher A, PA-C  ondansetron (ZOFRAN ODT) 4 MG disintegrating tablet Take 1 tablet (4 mg total) by mouth every 8 (eight) hours as needed for nausea or vomiting. 09/14/17   Jeanie Sewer, PA-C    Family History Family History  Problem Relation Age of  Onset  . Hypertension Father     Social History Social History   Tobacco Use  . Smoking status: Passive Smoke Exposure - Never Smoker  . Smokeless tobacco: Never Used  Substance Use Topics  . Alcohol use: No  . Drug use: No     Allergies   Patient has no known allergies.   Review of Systems Review of Systems Ten systems reviewed and are negative for acute change, except as noted in the HPI.    Physical Exam Updated Vital Signs BP 117/82 (BP Location: Right Arm)   Pulse (!) 103   Temp 98 F (36.7 C) (Oral)   Resp 16   LMP 07/16/2017   SpO2 98%   Physical Exam Physical Exam  Nursing note and vitals reviewed. Constitutional: She is oriented to person, place, and time. She appears well-developed and well-nourished. No distress.  HENT:  Head: Normocephalic and atraumatic.  Eyes: Conjunctivae normal and EOM are normal. Pupils are equal, round, and reactive to light. No scleral icterus. Mouth: Mild pharyngeal erythema without exudates.  Neck: Normal range of motion.  Cardiovascular: Normal rate, regular rhythm and normal heart sounds.  Exam reveals no gallop and no friction rub.   No murmur heard. Pulmonary/Chest: Effort normal and breath sounds normal. No respiratory distress.  Abdominal: Soft. Bowel sounds are normal.  Gravid abdomen  there is no tenderness. There is no guarding.  Neurological: She is alert and oriented to person, place, and time.  Skin: Skin is warm and dry. She is not diaphoretic.     ED Treatments / Results  Labs (all labs ordered are listed, but only abnormal results are displayed) Labs Reviewed - No data to display  EKG None  Radiology No results found.  Procedures Procedures (including critical care time)  Medications Ordered in ED Medications - No data to display   Initial Impression / Assessment and Plan / ED Course  I have reviewed the triage vital signs and the nursing notes.  Pertinent labs & imaging results that were  available during my care of the patient were reviewed by me and considered in my medical decision making (see chart for details).     Patient with symptoms of URI.  She is currently pregnant.  Encouraged to take Tylenol for pain and symptomatic treatment including saline nasal spray, hot tea and rest.  Patient is encouraged to follow-up with her OB/GYN.  She appears appropriate for discharge at this time  Final Clinical Impressions(s) / ED Diagnoses   Final diagnoses:  Viral URI with cough    ED Discharge Orders    None       Arthor Captain, PA-C 11/06/17 1657    Linwood Dibbles, MD 11/08/17 [phone]

## 2017-11-06 NOTE — ED Notes (Signed)
Pt on her cell no distress

## 2017-11-06 NOTE — ED Triage Notes (Signed)
Pt reports 3 days of yellowish nasal drainage with sneezing. Pt denies cough, cp or shortness of breath. Pt is [redacted] weeks pregnant with no abd pain, n.v or vaginal bleeding.

## 2017-11-19 ENCOUNTER — Other Ambulatory Visit: Payer: Self-pay | Admitting: Obstetrics & Gynecology

## 2017-11-19 ENCOUNTER — Other Ambulatory Visit (HOSPITAL_COMMUNITY): Payer: Self-pay | Admitting: Obstetrics & Gynecology

## 2017-11-19 DIAGNOSIS — Z3689 Encounter for other specified antenatal screening: Secondary | ICD-10-CM

## 2017-11-19 DIAGNOSIS — Z3A19 19 weeks gestation of pregnancy: Secondary | ICD-10-CM

## 2017-11-20 ENCOUNTER — Encounter (HOSPITAL_COMMUNITY): Payer: Self-pay

## 2017-11-27 ENCOUNTER — Encounter (HOSPITAL_COMMUNITY): Payer: Self-pay | Admitting: *Deleted

## 2017-11-27 ENCOUNTER — Ambulatory Visit (HOSPITAL_COMMUNITY)
Admission: RE | Admit: 2017-11-27 | Discharge: 2017-11-27 | Disposition: A | Discharge status: Home / Self Care | Payer: Medicaid Other | Source: Ambulatory Visit | Attending: Obstetrics & Gynecology | Admitting: Obstetrics & Gynecology

## 2017-11-27 DIAGNOSIS — O358XX0 Maternal care for other (suspected) fetal abnormality and damage, not applicable or unspecified: Secondary | ICD-10-CM | POA: Insufficient documentation

## 2017-11-27 DIAGNOSIS — Z363 Encounter for antenatal screening for malformations: Secondary | ICD-10-CM | POA: Diagnosis present

## 2017-11-27 DIAGNOSIS — Z3A19 19 weeks gestation of pregnancy: Secondary | ICD-10-CM

## 2017-11-27 DIAGNOSIS — Z3689 Encounter for other specified antenatal screening: Secondary | ICD-10-CM

## 2017-11-28 ENCOUNTER — Other Ambulatory Visit (HOSPITAL_COMMUNITY): Payer: Self-pay | Admitting: *Deleted

## 2017-11-28 DIAGNOSIS — O359XX0 Maternal care for (suspected) fetal abnormality and damage, unspecified, not applicable or unspecified: Secondary | ICD-10-CM

## 2017-12-08 ENCOUNTER — Other Ambulatory Visit (HOSPITAL_COMMUNITY): Payer: Self-pay

## 2017-12-12 ENCOUNTER — Telehealth (HOSPITAL_COMMUNITY): Payer: Self-pay | Admitting: Maternal and Fetal Medicine

## 2017-12-12 NOTE — Telephone Encounter (Signed)
Patient given the result of her NIPT which was low risk.

## 2017-12-25 ENCOUNTER — Encounter (HOSPITAL_COMMUNITY): Payer: Self-pay

## 2017-12-25 ENCOUNTER — Ambulatory Visit (HOSPITAL_COMMUNITY)
Admission: RE | Admit: 2017-12-25 | Discharge: 2017-12-25 | Disposition: A | Discharge status: Home / Self Care | Payer: Medicaid Other | Source: Ambulatory Visit | Attending: Obstetrics & Gynecology | Admitting: Obstetrics & Gynecology

## 2017-12-25 DIAGNOSIS — Z3A23 23 weeks gestation of pregnancy: Secondary | ICD-10-CM | POA: Insufficient documentation

## 2017-12-25 DIAGNOSIS — O359XX0 Maternal care for (suspected) fetal abnormality and damage, unspecified, not applicable or unspecified: Secondary | ICD-10-CM

## 2017-12-25 DIAGNOSIS — Z363 Encounter for antenatal screening for malformations: Secondary | ICD-10-CM | POA: Diagnosis not present

## 2017-12-25 HISTORY — DX: Other specified health status: Z78.9

## 2018-01-08 NOTE — L&D Delivery Note (Signed)
Patient: Gabriela Jones MRN: [id number]  GBS status: negative, IAP: not indicated  Patient is a 23 y.o. now G1P1 s/p NSVD at [redacted]w[redacted]d, who was admitted for SOL. SROM 5h 80m prior to delivery with clear fluid.    Delivery Note At 11:22 PM a viable female was delivered via Vaginal, Spontaneous (Presentation: cephalic; ROA).  APGAR: 8, 9; weight: pending (appears AGA).   Placenta status: intact, 3-vessel cord.  Cord:  with the following complications: none.  Cord pH: not collected  Head delivered ROA. No nuchal cord present. Shoulder and body delivered in usual fashion. Infant with spontaneous cry, placed on mother's abdomen, dried and bulb suctioned. Cord clamped x 2 after 1-minute delay, and cut by family member. Cord blood drawn. Placenta delivered spontaneously with gentle cord traction. Fundus firm with massage and Pitocin. Perineum inspected and found to have 2nd degree perineal laceration, which was repaired with 3.0 vicryl with good hemostasis achieved.  Anesthesia: epidural   Episiotomy: None Lacerations: 2nd degree;Perineal Suture Repair: 3.0 vicryl Est. Blood Loss (mL): 606   Mom to postpartum.  Baby to Couplet care / Skin to Skin.  Gwenevere Abbot 04/14/2018, 11:52 PM

## 2018-03-24 LAB — OB RESULTS CONSOLE GBS: GBS: NEGATIVE

## 2018-03-24 LAB — OB RESULTS CONSOLE GC/CHLAMYDIA
Chlamydia: NEGATIVE
Gonorrhea: NEGATIVE

## 2018-04-14 ENCOUNTER — Inpatient Hospital Stay (HOSPITAL_COMMUNITY): Payer: Medicaid Other | Admitting: Anesthesiology

## 2018-04-14 ENCOUNTER — Encounter (HOSPITAL_COMMUNITY): Payer: Self-pay | Admitting: *Deleted

## 2018-04-14 ENCOUNTER — Inpatient Hospital Stay (HOSPITAL_COMMUNITY)
Admission: AD | Admit: 2018-04-14 | Discharge: 2018-04-16 | DRG: 807 | Disposition: A | Discharge status: Home / Self Care | Payer: Medicaid Other | Attending: Obstetrics and Gynecology | Admitting: Obstetrics and Gynecology

## 2018-04-14 ENCOUNTER — Other Ambulatory Visit: Payer: Self-pay

## 2018-04-14 DIAGNOSIS — O4202 Full-term premature rupture of membranes, onset of labor within 24 hours of rupture: Secondary | ICD-10-CM | POA: Diagnosis not present

## 2018-04-14 DIAGNOSIS — Z7722 Contact with and (suspected) exposure to environmental tobacco smoke (acute) (chronic): Secondary | ICD-10-CM | POA: Diagnosis present

## 2018-04-14 DIAGNOSIS — O4292 Full-term premature rupture of membranes, unspecified as to length of time between rupture and onset of labor: Principal | ICD-10-CM | POA: Diagnosis present

## 2018-04-14 DIAGNOSIS — Z3A39 39 weeks gestation of pregnancy: Secondary | ICD-10-CM

## 2018-04-14 LAB — TYPE AND SCREEN
ABO/RH(D): O POS
Antibody Screen: NEGATIVE

## 2018-04-14 LAB — CBC
HCT: 40.8 % (ref 36.0–46.0)
Hemoglobin: 12.8 g/dL (ref 12.0–15.0)
MCH: 28.1 pg (ref 26.0–34.0)
MCHC: 31.4 g/dL (ref 30.0–36.0)
MCV: 89.5 fL (ref 80.0–100.0)
Platelets: 130 10*3/uL — ABNORMAL LOW (ref 150–400)
RBC: 4.56 MIL/uL (ref 3.87–5.11)
RDW: 13.8 % (ref 11.5–15.5)
WBC: 9 10*3/uL (ref 4.0–10.5)
nRBC: 0 % (ref 0.0–0.2)

## 2018-04-14 MED ORDER — TERBUTALINE SULFATE 1 MG/ML IJ SOLN: 0.2500 mg | Freq: Once | INTRAMUSCULAR | Status: DC | PRN

## 2018-04-14 MED ORDER — PHENYLEPHRINE 40 MCG/ML (10ML) SYRINGE FOR IV PUSH (FOR BLOOD PRESSURE SUPPORT): 80.0000 ug | PREFILLED_SYRINGE | INTRAVENOUS | Status: DC | PRN

## 2018-04-14 MED ORDER — LACTATED RINGERS IV SOLN
INTRAVENOUS | Status: DC
  Administered 2018-04-14 (×2): via INTRAVENOUS

## 2018-04-14 MED ORDER — LIDOCAINE HCL (PF) 1 % IJ SOLN
30.0000 mL | INTRAMUSCULAR | Status: DC | PRN
  Filled 2018-04-14: qty 30

## 2018-04-14 MED ORDER — EPHEDRINE 5 MG/ML INJ: 10.0000 mg | INTRAVENOUS | Status: DC | PRN

## 2018-04-14 MED ORDER — OXYCODONE-ACETAMINOPHEN 5-325 MG PO TABS: 1.0000 | ORAL_TABLET | ORAL | Status: DC | PRN

## 2018-04-14 MED ORDER — DIPHENHYDRAMINE HCL 50 MG/ML IJ SOLN: 12.5000 mg | INTRAMUSCULAR | Status: DC | PRN

## 2018-04-14 MED ORDER — OXYTOCIN 40 UNITS IN NORMAL SALINE INFUSION - SIMPLE MED
1.0000 m[IU]/min | INTRAVENOUS | Status: DC
  Filled 2018-04-14: qty 1000

## 2018-04-14 MED ORDER — SODIUM CHLORIDE (PF) 0.9 % IJ SOLN
INTRAMUSCULAR | Status: DC | PRN
  Administered 2018-04-14: 12 mL/h via EPIDURAL

## 2018-04-14 MED ORDER — LIDOCAINE HCL (PF) 1 % IJ SOLN
INTRAMUSCULAR | Status: DC | PRN
  Administered 2018-04-14 (×2): 4 mL via EPIDURAL

## 2018-04-14 MED ORDER — OXYTOCIN 40 UNITS IN NORMAL SALINE INFUSION - SIMPLE MED
2.5000 [IU]/h | INTRAVENOUS | Status: DC
  Administered 2018-04-15: 2.5 [IU]/h via INTRAVENOUS

## 2018-04-14 MED ORDER — FENTANYL-BUPIVACAINE-NACL 0.5-0.125-0.9 MG/250ML-% EP SOLN
12.0000 mL/h | EPIDURAL | Status: DC | PRN
  Filled 2018-04-14: qty 250

## 2018-04-14 MED ORDER — LACTATED RINGERS IV SOLN
500.0000 mL | Freq: Once | INTRAVENOUS | Status: AC
  Administered 2018-04-14: 500 mL via INTRAVENOUS

## 2018-04-14 MED ORDER — ACETAMINOPHEN 325 MG PO TABS: 650.0000 mg | ORAL_TABLET | ORAL | Status: DC | PRN

## 2018-04-14 MED ORDER — FENTANYL-BUPIVACAINE-NACL 0.5-0.125-0.9 MG/250ML-% EP SOLN: 12.0000 mL/h | EPIDURAL | Status: DC | PRN

## 2018-04-14 MED ORDER — ONDANSETRON HCL 4 MG/2ML IJ SOLN: 4.0000 mg | Freq: Four times a day (QID) | INTRAMUSCULAR | Status: DC | PRN

## 2018-04-14 MED ORDER — SOD CITRATE-CITRIC ACID 500-334 MG/5ML PO SOLN: 30.0000 mL | ORAL | Status: DC | PRN

## 2018-04-14 MED ORDER — OXYCODONE-ACETAMINOPHEN 5-325 MG PO TABS: 2.0000 | ORAL_TABLET | ORAL | Status: DC | PRN

## 2018-04-14 MED ORDER — OXYTOCIN BOLUS FROM INFUSION
500.0000 mL | Freq: Once | INTRAVENOUS | Status: AC
  Administered 2018-04-14: 500 mL via INTRAVENOUS

## 2018-04-14 MED ORDER — LACTATED RINGERS IV SOLN
500.0000 mL | INTRAVENOUS | Status: DC | PRN
  Administered 2018-04-14: 500 mL via INTRAVENOUS

## 2018-04-14 MED ORDER — FENTANYL CITRATE (PF) 100 MCG/2ML IJ SOLN
100.0000 ug | INTRAMUSCULAR | Status: DC | PRN
  Administered 2018-04-14: 100 ug via INTRAVENOUS
  Filled 2018-04-14: qty 2

## 2018-04-14 NOTE — Progress Notes (Signed)
Bedside U/S performed by M. Denyse Amass, CNM to verify presentation.  Fetus vertex.

## 2018-04-14 NOTE — Progress Notes (Signed)
Cephalic confirmed by [place].

## 2018-04-14 NOTE — Progress Notes (Signed)
LABOR PROGRESS NOTE  Gabriela Jones is a 23 y.o. G1P0 at [redacted]w[redacted]d admitted for SOL/SROM  Subjective: Feeling more pain with contractions Not quite ready for epidural  Objective: BP (!) 120/54   Pulse 97   Temp 98.5 F (36.9 C) (Oral)   Resp 18   Ht 5\' 11"  (1.803 m)   Wt 99.8 kg   LMP 06/16/2017   SpO2 98%   BMI 30.68 kg/m  or  Vitals:   04/14/18 1906 04/14/18 1940 04/14/18 2100 04/14/18 2102  BP: 132/75   (!) 120/54  Pulse: (!) 101 98  97  Resp:  18    Temp:  99.1 F (37.3 C) 98.5 F (36.9 C)   TempSrc:  Oral Oral   SpO2:      Weight:      Height:        Dilation: 5 Effacement (%): 80, 90 Station: -2 Presentation: Vertex Exam by:: MD Janina Mayo: baseline rate 140, moderate varibility, + acel, variable decel Toco: regular q2-74m  Labs: Lab Results  Component Value Date   WBC 9.0 04/14/2018   HGB 12.8 04/14/2018   HCT 40.8 04/14/2018   MCV 89.5 04/14/2018   PLT 130 (L) 04/14/2018    Patient Active Problem List   Diagnosis Date Noted  . Indication for care in labor or delivery 04/14/2018  . Acne vulgaris 03/30/2016    Assessment / Plan: 23 y.o. G1P0 at [redacted]w[redacted]d here for SOL/SROM  Labor: minimal change since last check. Will start pitocin  Fetal Wellbeing:  Cat 2 (reassuring) Pain Control:  Fentanyl. Desires epidural later.  Anticipated MOD:  SVD  Tanmay Halteman,MD OB Fellow  04/14/2018, 9:16 PM

## 2018-04-14 NOTE — Anesthesia Procedure Notes (Signed)
Epidural Patient location during procedure: OB  Staffing Anesthesiologist: Janasha Barkalow, MD Performed: anesthesiologist   Preanesthetic Checklist Completed: patient identified, pre-op evaluation, timeout performed, IV checked, risks and benefits discussed and monitors and equipment checked  Epidural Patient position: sitting Prep: site prepped and draped and DuraPrep Patient monitoring: heart rate, continuous pulse ox and blood pressure Approach: midline Location: L3-L4 Injection technique: LOR air and LOR saline  Needle:  Needle type: Tuohy  Needle gauge: 17 G Needle length: 9 cm Needle insertion depth: 7 cm Catheter type: closed end flexible Catheter size: 19 Gauge Catheter at skin depth: 12 cm Test dose: negative  Assessment Sensory level: T8 Events: blood not aspirated, injection not painful, no injection resistance, negative IV test and no paresthesia  Additional Notes Reason for block:procedure for pain     

## 2018-04-14 NOTE — Anesthesia Preprocedure Evaluation (Signed)
Anesthesia Evaluation  Patient identified by MRN, date of birth, ID band Patient awake    Reviewed: Allergy & Precautions, Patient's Chart, lab work & pertinent test results  Airway Mallampati: II  TM Distance: >3 FB Neck ROM: Full    Dental   Pulmonary neg pulmonary ROS,    Pulmonary exam normal breath sounds clear to auscultation       Cardiovascular negative cardio ROS Normal cardiovascular exam Rhythm:Regular Rate:Normal     Neuro/Psych negative neurological ROS  negative psych ROS   GI/Hepatic negative GI ROS, Neg liver ROS,   Endo/Other  negative endocrine ROS  Renal/GU negative Renal ROS     Musculoskeletal negative musculoskeletal ROS (+)   Abdominal   Peds  Hematology negative hematology ROS (+)   Anesthesia Other Findings   Reproductive/Obstetrics (+) Pregnancy                             Anesthesia Physical Anesthesia Plan  ASA: II  Anesthesia Plan: Epidural   Post-op Pain Management:    Induction:   PONV Risk Score and Plan:   Airway Management Planned:   Additional Equipment:   Intra-op Plan:   Post-operative Plan:   Informed Consent: I have reviewed the patients History and Physical, chart, labs and discussed the procedure including the risks, benefits and alternatives for the proposed anesthesia with the patient or authorized representative who has indicated his/her understanding and acceptance.       Plan Discussed with:   Anesthesia Plan Comments:         Anesthesia Quick Evaluation  

## 2018-04-14 NOTE — MAU Note (Signed)
Presents with c/o ctxs every 10 minutes.  Denies VB or LOF.  Reports +FM.

## 2018-04-14 NOTE — H&P (Signed)
LABOR AND DELIVERY ADMISSION HISTORY AND PHYSICAL NOTE  Gabriela Jones is a 23 y.o. female G1P0 with IUP at 6936w2d by LMP presenting for SOL & PROM in MAU.  She reports positive fetal movement. She denies leakage of fluid or vaginal bleeding.  Pren[facility] History/Complications[facility] Pregnancy complications:  - none  Past Medical History: Past Medical History:  Diagnosis Date  . Medical history non-contributory     Past Surgical History: Past Surgical History:  Procedure Laterality Date  . TONSILLECTOMY      Obstetrical History: OB History    Gravida  1   Para      Term      Preterm      AB      Living  0     SAB      TAB      Ectopic      Multiple      Live Births              Social History: Social History   Socioeconomic History  . Marital status: Single    Spouse name: Not on file  . Number of children: Not on file  . Years of education: Not on file  . Highest education level: Not on file  Occupational History  . Not on file  Social Needs  . Financial resource strain: Not on file  . Food insecurity:    Worry: Not on file    Inability: Not on file  . Transportation needs:    Medical: Not on file    Non-medical: Not on file  Tobacco Use  . Smoking status: Passive Smoke Exposure - Never Smoker  . Smokeless tobacco: Never Used  Substance and Sexual Activity  . Alcohol use: No  . Drug use: No  . Sexual activity: Yes    Birth control/protection: Implant    Comment[facility]  Lifestyle  . Physical activity:    Days per week: Not on file    Minutes per session: Not on file  . Stress: Not on file  Relationships  . Social connections:    Talks on phone: Not on file    Gets together: Not on file    Attends religious service: Not on file    Active member of club or organization: Not on file    Attends meetings of clubs or organizations: Not on file    Relationship status:  Not on file  Other Topics Concern  . Not on file  Social History Narrative  . Not on file    Family History: Family History  Problem Relation Age of Onset  . Hypertension Father     Allergies: No Known Allergies  Medications Prior to Admission  Medication Sig Dispense Refill Last Dose  . Prenatal Vit-Fe Fumarate-FA (PRENATAL VITAMIN PO) Take by mouth.   04/13/2018 at Unknown time  . cetirizine (ZYRTEC) 10 MG tablet Take 1 tablet (10 mg total) by mouth daily. (Patient not taking: Reported on 12/25/2017) 30 tablet 11 Not Taking  . DIFFERIN 0.1 % cream APPLY TOPICALLY AT BEDTIME TO AFFECTED AREAS OF ACNE AFTER CLEANSING FACE (Patient not taking: Reported on 12/25/2017) 45 g 2 Not Taking  . Doxylamine-Pyridoxine 10-10 MG TBEC Two tablets at bedtime on day 1 and 2; if symptoms persist, take 1 tablet in morning and 2 tablets at bedtime on day 3; if symptoms persist, may increase to 1 tablet in morning, 1 tablet  mid-afternoon, and 2 tablets at bedtime on day 4 (Patient not taking: Reported on 12/25/2017) 60 tablet 0 Not Taking  . ondansetron (ZOFRAN ODT) 4 MG disintegrating tablet Take 1 tablet (4 mg total) by mouth every 8 (eight) hours as needed for nausea or vomiting. (Patient not taking: Reported on 12/25/2017) 10 tablet 0 Not Taking   Review of Systems  All systems reviewed and negative except as stated in HPI  Physical Exam Blood pressure 133/76, pulse 92, temperature 98.1 F (36.7 C), temperature source Oral, resp. rate 18, height 5\' 11"  (1.803 m), weight 99.8 kg, last menstrual period 06/16/2017, SpO2 98 %. General appearance: alert, cooperative and no distress Lungs: clear to auscultation bilaterally Heart: regular rate and rhythm Abdomen: soft, non-tender; bowel sounds normal Extremities: No calf swelling or tenderness Presentation: cephalic  confirmed by U/S by M. Bhambri, CNM EFM - FHR: 140 bpm / moderate variability / accels present / decels absent / TOCO: regular every 6  mins   Dilation: 4 Effacement (%): 80 Station: -2 Exam by:: Doreatha Massed, RNC  Prenatal labs: ABO, Rh: O/Positive/-- (09/09 0000) Antibody: Negative (09/09 0000) Rubella: Immune (09/09 0000) RPR: Nonreactive (09/09 0000)  HBsAg: Negative (09/09 0000)  HIV: Non-reactive (09/09 0000)  GC/Chlamydia: Neg/Neg GBS: Negative (03/16 0000)  1 hr Glucola: Normal  110 Genetic screening:  Normal Panorama Anatomy US: Normal  Prenatal Transfer Tool  Maternal Diabetes: No Genetic Screening: Normal Maternal Ultrasounds/Referrals: Normal Fetal Ultrasounds or other Referrals:  None Maternal Substance Abuse:  No Significant Maternal Medications:  None Significant Maternal Lab Results: Lab values include: Group B Strep negative  No results found for this or any previous visit (from the past 24 hour(s)).  Patient Active Problem List   Diagnosis Date Noted  . Indication for care in labor or delivery 04/14/2018  . Acne vulgaris 03/30/2016    Assessment: Gabriela Jones is a 23 y.o. G1P0 at [redacted]w[redacted]d here for SOL & PROM  #Labor: active #Pain: Minimally controlled #FWB: Category 1 #ID:  N/A #MOF: Bottle - formula #MOC:Depo #Circ:  Yes (outpatient)  Raelyn Mora, CNM 04/14/2018, 6:48 PM

## 2018-04-15 ENCOUNTER — Encounter (HOSPITAL_COMMUNITY): Payer: Self-pay

## 2018-04-15 LAB — RPR: RPR Ser Ql: NONREACTIVE

## 2018-04-15 MED ORDER — ONDANSETRON HCL 4 MG/2ML IJ SOLN: 4.0000 mg | INTRAMUSCULAR | Status: DC | PRN

## 2018-04-15 MED ORDER — BENZOCAINE-MENTHOL 20-0.5 % EX AERO
1.0000 "application " | INHALATION_SPRAY | CUTANEOUS | Status: DC | PRN
  Administered 2018-04-15: 1 via TOPICAL
  Filled 2018-04-15: qty 56

## 2018-04-15 MED ORDER — DIBUCAINE 1 % RE OINT: 1.0000 "application " | TOPICAL_OINTMENT | RECTAL | Status: DC | PRN

## 2018-04-15 MED ORDER — PRENATAL MULTIVITAMIN CH
1.0000 | ORAL_TABLET | Freq: Every day | ORAL | Status: DC
  Administered 2018-04-15: 12:00:00 1 via ORAL
  Filled 2018-04-15: qty 1

## 2018-04-15 MED ORDER — SENNOSIDES-DOCUSATE SODIUM 8.6-50 MG PO TABS
2.0000 | ORAL_TABLET | ORAL | Status: DC
  Administered 2018-04-16: 2 via ORAL
  Filled 2018-04-15: qty 2

## 2018-04-15 MED ORDER — DIPHENHYDRAMINE HCL 25 MG PO CAPS: 25.0000 mg | ORAL_CAPSULE | Freq: Four times a day (QID) | ORAL | Status: DC | PRN

## 2018-04-15 MED ORDER — WITCH HAZEL-GLYCERIN EX PADS: 1.0000 "application " | MEDICATED_PAD | CUTANEOUS | Status: DC | PRN

## 2018-04-15 MED ORDER — SIMETHICONE 80 MG PO CHEW: 80.0000 mg | CHEWABLE_TABLET | ORAL | Status: DC | PRN

## 2018-04-15 MED ORDER — MEASLES, MUMPS & RUBELLA VAC IJ SOLR: 0.5000 mL | Freq: Once | INTRAMUSCULAR | Status: DC

## 2018-04-15 MED ORDER — COCONUT OIL OIL: 1.0000 "application " | TOPICAL_OIL | Status: DC | PRN

## 2018-04-15 MED ORDER — TETANUS-DIPHTH-ACELL PERTUSSIS 5-2.5-18.5 LF-MCG/0.5 IM SUSP: 0.5000 mL | Freq: Once | INTRAMUSCULAR | Status: DC

## 2018-04-15 MED ORDER — ACETAMINOPHEN 325 MG PO TABS: 650.0000 mg | ORAL_TABLET | ORAL | Status: DC | PRN

## 2018-04-15 MED ORDER — ONDANSETRON HCL 4 MG PO TABS: 4.0000 mg | ORAL_TABLET | ORAL | Status: DC | PRN

## 2018-04-15 MED ORDER — IBUPROFEN 600 MG PO TABS
600.0000 mg | ORAL_TABLET | Freq: Four times a day (QID) | ORAL | Status: DC
  Administered 2018-04-15 – 2018-04-16 (×5): 600 mg via ORAL
  Filled 2018-04-15 (×5): qty 1

## 2018-04-15 NOTE — Progress Notes (Signed)
Post Partum Day 1 Subjective: no complaints, up ad lib, voiding, tolerating PO and + flatus  Objective: Blood pressure 121/70, pulse 96, temperature 98.4 F (36.9 C), temperature source Axillary, resp. rate 17, height 5\' 11"  (1.803 m), weight 99.8 kg, last menstrual period 06/16/2017, SpO2 96 %, unknown if currently breastfeeding.  Physical Exam:  General: alert, cooperative and no distress Lochia: appropriate Uterine Fundus: firm DVT Evaluation: No evidence of DVT seen on physical exam. Negative Homan's sign. No cords or calf tenderness. No significant calf/ankle edema.  Recent Labs    04/14/18 1814  HGB 12.8  HCT 40.8    Assessment/Plan: Plan for discharge tomorrow   LOS: 1 day   Gabriela Jones 04/15/2018, 12:38 PM

## 2018-04-15 NOTE — Discharge Summary (Signed)
Postpartum Discharge Summary     Patient Name: Gabriela Jones DOB: 10-01-1995 MRN: [id number]  Date of admission: 04/14/2018 Delivering Provider: Gwenevere Abbot   Date of discharge: 04/16/2018  Admitting diagnosis: STOMACH PAIN Intrauterine pregnancy: [redacted]w[redacted]d     Secondary diagnosis:  Active Problems:   Indication for care in labor or delivery  Additional problems: none     Discharge diagnosis: Term Pregnancy Delivered                                                                                                Post partum procedures:none  Augmentation: None  Complications: None  Hospital course:  Onset of Labor With Vaginal Delivery     23 y.o. yo G1P0 at [redacted]w[redacted]d was admitted in Active Labor on 04/14/2018. Patient had an uncomplicated labor course as follows:  Membrane Rupture Time/Date: 5:41 PM ,04/14/2018   Intrapartum Procedures: Episiotomy: None [1]                                         Lacerations:  2nd degree [3];Perineal [11]  Patient had a delivery of a Viable infant. 04/14/2018  Information for the patient's newborn:  Jeanifer, Drayton [[id number]]  Delivery Method: Vaginal, Spontaneous(Filed from Delivery Summary)    Pateint had an uncomplicated postpartum course.  She is ambulating, tolerating a regular diet, passing flatus, and urinating well. Patient is discharged home in stable condition on 04/16/18.   Magnesium Sulfate recieved: No BMZ received: No  Physical exam  Vitals:   04/15/18 1030 04/15/18 1355 04/15/18 2212 04/16/18 0524  BP: 121/70 119/76 112/73 121/74  Pulse: 96 93 97 90  Resp: 17 18 18 16   Temp: 98.4 F (36.9 C) 98.1 F (36.7 C) 98.3 F (36.8 C) 98.2 F (36.8 C)  TempSrc: Axillary Axillary Oral Oral  SpO2:  99% 98% 100%  Weight:      Height:       General: alert, cooperative and no distress Lochia: appropriate Uterine Fundus: firm Incision: N/A DVT Evaluation: No evidence of DVT seen on physical exam. Labs: Lab Results  Component  Value Date   WBC 9.0 04/14/2018   HGB 12.8 04/14/2018   HCT 40.8 04/14/2018   MCV 89.5 04/14/2018   PLT 130 (L) 04/14/2018   CMP Latest Ref Rng & Units 09/14/2017  Glucose 70 - 99 mg/dL 74  BUN 6 - 20 mg/dL 8  Creatinine 5.80 - 9.98 mg/dL 3.38  Sodium 250 - 539 mmol/L 134(L)  Potassium 3.5 - 5.1 mmol/L 3.6  Chloride 98 - 111 mmol/L 103  CO2 22 - 32 mmol/L 23  Calcium 8.9 - 10.3 mg/dL 9.2  Total Protein 6.5 - 8.1 g/dL -  Total Bilirubin 0.3 - 1.2 mg/dL -  Alkaline Phos 38 - 767 U/L -  AST 15 - 41 U/L -  ALT 0 - 44 U/L -    Discharge instruction: per After Visit Summary and "Baby and Me Booklet".  After visit meds:  Allergies as of 04/16/2018  No Known Allergies     Medication List    TAKE these medications   ibuprofen 600 MG tablet Commonly known as:  ADVIL,MOTRIN Take 1 tablet (600 mg total) by mouth every 6 (six) hours.   PRENATAL VITAMIN PO Take by mouth.       Diet: routine diet  Activity: Advance as tolerated. Pelvic rest for 6 weeks.   Outpatient follow up:4 weeks Follow up Appt:No future appointments. Follow up Visit: [facility] 4-6 weeks   Newborn Data: Live born female  Birth Weight:   APGAR: 8, 9  Newborn Delivery   Birth date/time:  04/14/2018 23:22:00 Delivery type:  Vaginal, Spontaneous     Baby Feeding: Bottle Disposition:home with mother   04/16/2018 Scheryl DarterJames , MD

## 2018-04-15 NOTE — Lactation Note (Signed)
This note was copied from a baby's chart. Lactation Consultation Note  Patient Name: Gabriela Jones ACZYS'A Date: 04/15/2018  P1, 2 hour female infant. Mom's feeding choice at admission was breast and bottle feeding. LC entered room Mom stated she only plans to formula feed,  she doesn't want to breastfeed infant  she changed her mind.  Mom knows to ask for [facility] if  In the future she choose to try to breastfeed her baby.   Maternal Data    Feeding    LATCH Score                   Interventions    Lactation Tools Discussed/Used WIC Program: Yes   Consult Status      Danelle Earthly 04/15/2018, 2:11 AM

## 2018-04-15 NOTE — Anesthesia Postprocedure Evaluation (Signed)
Anesthesia Post Note  Patient: Gabriela Jones  Procedure(s) Performed: AN AD HOC LABOR EPIDURAL     Patient location during evaluation: Mother Baby Anesthesia Type: Epidural Level of consciousness: awake Pain management: satisfactory to patient Vital Signs Assessment: post-procedure vital signs reviewed and stable Respiratory status: spontaneous breathing Cardiovascular status: stable Anesthetic complications: no    Last Vitals:  Vitals:   04/15/18 0236 04/15/18 0635  BP: 131/67 126/74  Pulse: 84 98  Resp: 16 16  Temp: 37.5 C 37.6 C  SpO2:      Last Pain:  Vitals:   04/15/18 0635  TempSrc: Oral  PainSc: 0-No pain   Pain Goal: Patients Stated Pain Goal: 6 (04/14/18 2043)                 Cephus Shelling

## 2018-04-16 MED ORDER — IBUPROFEN 600 MG PO TABS: 600.0000 mg | ORAL_TABLET | Freq: Four times a day (QID) | ORAL | 0 refills | Status: DC

## 2018-04-16 NOTE — Discharge Instructions (Signed)
Vaginal Delivery, Care After °Refer to this sheet in the next few weeks. These instructions provide you with information about caring for yourself after vaginal delivery. Your health care provider may also give you more specific instructions. Your treatment has been planned according to current medical practices, but problems sometimes occur. Call your health care provider if you have any problems or questions. °What can I expect after the procedure? °After vaginal delivery, it is common to have: °· Some bleeding from your vagina. °· Soreness in your abdomen, your vagina, and the area of skin between your vaginal opening and your anus (perineum). °· Pelvic cramps. °· Fatigue. °Follow these instructions at home: °Medicines °· Take over-the-counter and prescription medicines only as told by your health care provider. °· If you were prescribed an antibiotic medicine, take it as told by your health care provider. Do not stop taking the antibiotic until it is finished. °Driving ° °· Do not drive or operate heavy machinery while taking prescription pain medicine. °· Do not drive for 24 hours if you received a sedative. °Lifestyle °· Do not drink alcohol. This is especially important if you are breastfeeding or taking medicine to relieve pain. °· Do not use tobacco products, including cigarettes, chewing tobacco, or e-cigarettes. If you need help quitting, ask your health care provider. °Eating and drinking °· Drink at least 8 eight-ounce glasses of water every day unless you are told not to by your health care provider. If you choose to breastfeed your baby, you may need to drink more water than this. °· Eat high-fiber foods every day. These foods may help prevent or relieve constipation. High-fiber foods include: °? Whole grain cereals and breads. °? Brown rice. °? Beans. °? Fresh fruits and vegetables. °Activity °· Return to your normal activities as told by your health care provider. Ask your health care provider what  activities are safe for you. °· Rest as much as possible. Try to rest or take a nap when your baby is sleeping. °· Do not lift anything that is heavier than your baby or 10 lb (4.5 kg) until your health care provider says that it is safe. °· Talk with your health care provider about when you can engage in sexual activity. This may depend on your: °? Risk of infection. °? Rate of healing. °? Comfort and desire to engage in sexual activity. °Vaginal Care °· If you have an episiotomy or a vaginal tear, check the area every day for signs of infection. Check for: °? More redness, swelling, or pain. °? More fluid or blood. °? Warmth. °? Pus or a bad smell. °· Do not use tampons or douches until your health care provider says this is safe. °· Watch for any blood clots that may pass from your vagina. These may look like clumps of dark red, brown, or black discharge. °General instructions °· Keep your perineum clean and dry as told by your health care provider. °· Wear loose, comfortable clothing. °· Wipe from front to back when you use the toilet. °· Ask your health care provider if you can shower or take a bath. If you had an episiotomy or a perineal tear during labor and delivery, your health care provider may tell you not to take baths for a certain length of time. °· Wear a bra that supports your breasts and fits you well. °· If possible, have someone help you with household activities and help care for your baby for at least a few days after you   leave the hospital. °· Keep all follow-up visits for you and your baby as told by your health care provider. This is important. °Contact a health care provider if: °· You have: °? Vaginal discharge that has a bad smell. °? Difficulty urinating. °? Pain when urinating. °? A sudden increase or decrease in the frequency of your bowel movements. °? More redness, swelling, or pain around your episiotomy or vaginal tear. °? More fluid or blood coming from your episiotomy or vaginal  tear. °? Pus or a bad smell coming from your episiotomy or vaginal tear. °? A fever. °? A rash. °? Little or no interest in activities you used to enjoy. °? Questions about caring for yourself or your baby. °· Your episiotomy or vaginal tear feels warm to the touch. °· Your episiotomy or vaginal tear is separating or does not appear to be healing. °· Your breasts are painful, hard, or turn red. °· You feel unusually sad or worried. °· You feel nauseous or you vomit. °· You pass large blood clots from your vagina. If you pass a blood clot from your vagina, save it to show to your health care provider. Do not flush blood clots down the toilet without having your health care provider look at them. °· You urinate more than usual. °· You are dizzy or light-headed. °· You have not breastfed at all and you have not had a menstrual period for 12 weeks after delivery. °· You have stopped breastfeeding and you have not had a menstrual period for 12 weeks after you stopped breastfeeding. °Get help right away if: °· You have: °? Pain that does not go away or does not get better with medicine. °? Chest pain. °? Difficulty breathing. °? Blurred vision or spots in your vision. °? Thoughts about hurting yourself or your baby. °· You develop pain in your abdomen or in one of your legs. °· You develop a severe headache. °· You faint. °· You bleed from your vagina so much that you fill two sanitary pads in one hour. °This information is not intended to replace advice given to you by your health care provider. Make sure you discuss any questions you have with your health care provider. °Document Released: 12/23/1999 Document Revised: 06/08/2015 Document Reviewed: 01/09/2015 °Elsevier Interactive Patient Education © 2019 Elsevier Inc. ° °

## 2018-12-16 ENCOUNTER — Other Ambulatory Visit: Payer: Self-pay

## 2018-12-16 DIAGNOSIS — Z20822 Contact with and (suspected) exposure to covid-19: Secondary | ICD-10-CM

## 2018-12-17 LAB — NOVEL CORONAVIRUS, NAA: SARS-CoV-2, NAA: NOT DETECTED

## 2018-12-18 ENCOUNTER — Telehealth: Payer: Self-pay

## 2018-12-18 NOTE — Telephone Encounter (Signed)
Caller given negative result and verbalized understanding  

## 2019-02-06 ENCOUNTER — Encounter (HOSPITAL_COMMUNITY): Payer: Self-pay

## 2019-02-06 ENCOUNTER — Other Ambulatory Visit: Payer: Self-pay

## 2019-02-06 ENCOUNTER — Emergency Department (HOSPITAL_COMMUNITY)
Admission: EM | Admit: 2019-02-06 | Discharge: 2019-02-06 | Disposition: A | Discharge status: Home / Self Care | Payer: Medicaid Other | Attending: Emergency Medicine | Admitting: Emergency Medicine

## 2019-02-06 DIAGNOSIS — R0981 Nasal congestion: Secondary | ICD-10-CM | POA: Insufficient documentation

## 2019-02-06 DIAGNOSIS — R000 Tachycardia, unspecified: Secondary | ICD-10-CM | POA: Insufficient documentation

## 2019-02-06 DIAGNOSIS — R509 Fever, unspecified: Secondary | ICD-10-CM | POA: Diagnosis not present

## 2019-02-06 LAB — RAPID URINE DRUG SCREEN, HOSP PERFORMED
Amphetamines: NOT DETECTED
Barbiturates: NOT DETECTED
Benzodiazepines: NOT DETECTED
Cocaine: NOT DETECTED
Opiates: NOT DETECTED
Tetrahydrocannabinol: NOT DETECTED

## 2019-02-06 MED ORDER — ACETAMINOPHEN 325 MG PO TABS
650.0000 mg | ORAL_TABLET | Freq: Once | ORAL | Status: AC
  Administered 2019-02-06: 650 mg via ORAL
  Filled 2019-02-06: qty 2

## 2019-02-06 NOTE — ED Triage Notes (Signed)
Patient states runny nose since yesterday.  Checked temp at home and was 99.1.

## 2019-02-06 NOTE — ED Provider Notes (Signed)
[facility] EM[facility]ENC[facility] DEPARTMENT Provider Note   CSN: [id number] Arrival date & time: 02/06/19  1148     History Chief Complaint  Patient presents with  . Nasal Congestion    Gabriela Jones is a 24 y.o. female.  24 y.o female with a no PMH presents to the ED with a chief complaint of nasal congestion x yesterday. Patient reports this nasal congestion began yesterday, she has not tried any medication for improvement in symptoms.  She reports she measured her temperature today in she had a temperature of 99.1, was unsure what to do therefore she came to the ER for further evaluation.  Reports no other symptoms, has not had any sick contacts, has low suspicion for any COVID-19 exposures.  Denies any chest pain, shortness of breath, prior arrhythmias, abdominal pain, urinary symptoms.  The history is provided by the patient and medical records.       Past Medical History:  Diagnosis Date  . Medical history non-contributory     Patient Active Problem List   Diagnosis Date Noted  . Indication for care in labor or delivery 04/14/2018  . Acne vulgaris 03/30/2016    Past Surgical History:  Procedure Laterality Date  . TONSILLECTOMY       OB History    Gravida  1   Para  1   Term  1   Preterm      AB      Living  1     SAB      TAB      Ectopic      Multiple  0   Live Births  1           Family History  Problem Relation Age of Onset  . Hypertension Father     Social History   Tobacco Use  . Smoking status: Passive Smoke Exposure - Never Smoker  . Smokeless tobacco: Never Used  Substance Use Topics  . Alcohol use: No  . Drug use: No    Home Medications Prior to Admission medications   Medication Sig Start Date End Date Taking? Authorizing Provider  ibuprofen (ADVIL,MOTRIN) 600 MG tablet Take 1 tablet (600 mg total) by mouth every 6 (six) hours. 04/16/18   Woodroe Mode, MD  Prenatal Vit-Fe Fumarate-FA (PRENATAL VITAMIN PO)  Take by mouth.    [provider]    Allergies    Patient has no known allergies.  Review of Systems   Review of Systems  Constitutional: Negative for fever.  HENT: Positive for rhinorrhea. Negative for sore throat.     Physical Exam Updated Vital Signs BP 119/77 (BP Location: Right Arm)   Pulse (!) 109   Temp 98.6 F (37 C) (Oral)   Resp 16   Ht 5\' 11"  (1.803 m)   Wt 99 kg   LMP 01/21/2019   SpO2 99%   BMI 30.44 kg/m   Physical Exam Vitals and nursing note reviewed.  Constitutional:      General: She is not in acute distress.    Appearance: She is well-developed.  HENT:     Head: Normocephalic and atraumatic.     Mouth/Throat:     Pharynx: No oropharyngeal exudate.  Eyes:     Pupils: Pupils are equal, round, and reactive to light.  Cardiovascular:     Rate and Rhythm: Regular rhythm. Tachycardia present.     Heart sounds: Normal heart sounds.  Pulmonary:     Effort: Pulmonary effort  is normal. No respiratory distress.     Breath sounds: Normal breath sounds.  Abdominal:     General: Bowel sounds are normal. There is no distension.     Palpations: Abdomen is soft.     Tenderness: There is no abdominal tenderness.  Musculoskeletal:        General: No tenderness or deformity.     Cervical back: Normal range of motion.     Right lower leg: No edema.     Left lower leg: No edema.  Skin:    General: Skin is warm and dry.  Neurological:     Mental Status: She is alert and oriented to person, place, and time.     ED Results / Procedures / Treatments   Labs (all labs ordered are listed, but only abnormal results are displayed) Labs Reviewed  RAPID URINE DRUG SCREEN, HOSP PERFORMED    EKG EKG Interpretation  Date/Time:  Friday February 06 2019 12:33:51 EST Ventricular Rate:  101 PR Interval:    QRS Duration: 92 QT Interval:  344 QTC Calculation: 446 R Axis:   63 Text Interpretation: Sinus tachycardia Since last tracing rate faster Confirmed  by Jacalyn Lefevre [id number]) on 02/06/2019 12:38:02 PM   Radiology No results found.  Procedures Procedures (including critical care time)  Medications Ordered in ED Medications  acetaminophen (TYLENOL) tablet 650 mg (650 mg Oral Given 02/06/19 1229)    ED Course  I have reviewed the triage vital signs and the nursing notes.  Pertinent labs & imaging results that were available during my care of the patient were reviewed by me and considered in my medical decision making (see chart for details).    MDM Rules/Calculators/A&P    Nontoxic well-appearing 24 year old comes into the ED with complaints of nasal congestion since yesterday.  Has not tried any medication for improvement in her symptoms.  She reports after recording her temperature at home today she was 99.1, was unsure whether she needed to be evaluated in the ED.  Patient did arrived in the ED with a heart rate in the 124's, does not have any chest pain, shortness of breath, denies any illicit drug use, prior arrhythmia.  1:48 PM spoke to lab due to delay in tube station, UDS still has not been processed.  EKG was obtained for further clarification which showed normal sinus rhythm without any delta waves.  Patient is overall well-appearing, UDS was obtained however due to delay in lab station does not resolve.  Patient heart rate has improved to 109 after drinking fluids and obtaining Tylenol, she does report she feels somewhat nervous while in the ED.  Denies any complaints such as abdominal pain, URI symptoms.  She was recommended to use some Claritin along with over-the-counter medication to relieve her nasal congestion.  Patient is encouraged to follow-up with PCP as needed.  Patient secondary to management, discharged in stable condition.   Portions of this note were generated with Scientist, clinical (histocompatibility and immunogenetics). Dictation errors may occur despite best attempts at proofreading.  Final Clinical Impression(s) / ED Diagnoses Final  diagnoses:  Nasal congestion    Rx / DC Orders ED Discharge Orders    None       Claude Manges, PA-C 02/06/19 1418    Jacalyn Lefevre, MD 02/09/19 [phone]

## 2019-02-06 NOTE — Discharge Instructions (Addendum)
You may use some Claritin over-the-counter, you may also try some Afrin nasal spray to help with your nasal congestion.  Please follow-up with your primary care physician as needed.
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
## 2019-03-03 ENCOUNTER — Ambulatory Visit (INDEPENDENT_AMBULATORY_CARE_PROVIDER_SITE_OTHER): Payer: Medicaid Other | Admitting: Primary Care

## 2019-03-12 ENCOUNTER — Other Ambulatory Visit: Payer: Self-pay

## 2019-03-12 ENCOUNTER — Ambulatory Visit (INDEPENDENT_AMBULATORY_CARE_PROVIDER_SITE_OTHER): Payer: Medicaid Other | Admitting: Primary Care

## 2019-03-12 ENCOUNTER — Encounter (INDEPENDENT_AMBULATORY_CARE_PROVIDER_SITE_OTHER): Payer: Self-pay | Admitting: Primary Care

## 2019-03-12 DIAGNOSIS — Z7689 Persons encountering health services in other specified circumstances: Secondary | ICD-10-CM

## 2019-03-12 NOTE — Progress Notes (Signed)
Virtual Visit via Telephone Note  I connected with Gabriela Jones on 03/12/19 at  3:30 PM EST by telephone and verified that I am speaking with the correct person using two identifiers.   I discussed the limitations, risks, security and privacy concerns of performing an evaluation and management service by telephone and the availability of in person appointments. I also discussed with the patient that there may be a patient responsible charge related to this service. The patient expressed understanding and agreed to proceed.   History of Present Illness: Gabriela Jones is establishing care with new provider she was last seen at [facility] 07/2017. She has no complaints or concerns at this time.    Past Medical History:  Diagnosis Date  . Medical history non-contributory    No current outpatient medications on file prior to visit.   No current facility-administered medications on file prior to visit.   Observations/Objective: Review of Systems  All other systems reviewed and are negative.  Assessment and Plan: Gabriela Jones was seen today for new patient (initial visit).  Diagnoses and all orders for this visit:  Encounter to establish care With new provider no problems or concerns.    Follow Up Instructions:    I discussed the assessment and treatment plan with the patient. The patient was provided an opportunity to ask questions and all were answered. The patient agreed with the plan and demonstrated an understanding of the instructions.   The patient was advised to call back or seek an in-person evaluation if the symptoms worsen or if the condition fails to improve as anticipated.  I provided 6 minutes of non-face-to-face time during this encounter.   Grayce Sessions, NP

## 2019-08-31 ENCOUNTER — Ambulatory Visit (INDEPENDENT_AMBULATORY_CARE_PROVIDER_SITE_OTHER): Payer: Medicaid Other | Admitting: Family Medicine

## 2019-08-31 ENCOUNTER — Encounter (INDEPENDENT_AMBULATORY_CARE_PROVIDER_SITE_OTHER): Payer: Self-pay | Admitting: Family Medicine

## 2019-08-31 ENCOUNTER — Other Ambulatory Visit: Payer: Self-pay

## 2019-08-31 VITALS — BP 109/72 | HR 90 | Ht 71.0 in | Wt 195.4 lb

## 2019-08-31 DIAGNOSIS — G44209 Tension-type headache, unspecified, not intractable: Secondary | ICD-10-CM

## 2019-08-31 MED ORDER — BUTALBITAL-APAP-CAFFEINE 50-325-40 MG PO TABS: 1.0000 | ORAL_TABLET | Freq: Two times a day (BID) | ORAL | 1 refills | Status: AC | PRN

## 2019-08-31 NOTE — Progress Notes (Signed)
States that she has been having headaches off and on.

## 2019-08-31 NOTE — Progress Notes (Signed)
Subjective:  Patient ID: Gabriela Jones, female    DOB: 1995/11/08  Age: 24 y.o. MRN: [id number]  CC: Headache   HPI Gabriela Jones presents for an acute visit.  She has headaches every now and then rated as 8/10 on the right side and has photophobia, phonophobia and OTC analgesics have been beneficial. Denies presence of nausea, blurry vision, sinus symptoms. Headacahes would usually last less than 3 hours. She denies any problems with sleep. Denies caffeine intake or tobacco use.  Past Medical History:  Diagnosis Date  . Medical history non-contributory     Past Surgical History:  Procedure Laterality Date  . TONSILLECTOMY      Family History  Problem Relation Age of Onset  . Hypertension Father     No Known Allergies  No outpatient medications prior to visit.   No facility-administered medications prior to visit.     ROS Review of Systems  Constitutional: Negative for activity change, appetite change and fatigue.  HENT: Negative for congestion, sinus pressure and sore throat.   Eyes: Negative for visual disturbance.  Respiratory: Negative for cough, chest tightness, shortness of breath and wheezing.   Cardiovascular: Negative for chest pain and palpitations.  Gastrointestinal: Negative for abdominal distention, abdominal pain and constipation.  Endocrine: Negative for polydipsia.  Genitourinary: Negative for dysuria and frequency.  Musculoskeletal: Negative for arthralgias and back pain.  Skin: Negative for rash.  Neurological: Positive for headaches. Negative for tremors, light-headedness and numbness.  Hematological: Does not bruise/bleed easily.  Psychiatric/Behavioral: Negative for agitation and behavioral problems.    Objective:  BP 109/72   Pulse 90   Ht 5\' 11"  (1.803 m)   Wt 195 lb 6.4 oz (88.6 kg)   SpO2 99%   BMI 27.25 kg/m   BP/Weight 08/31/2019 02/06/2019 04/16/2018  Systolic BP 109 125 121  Diastolic BP 72 71 74  Wt. (Lbs) 195.4 218.26 -   BMI 27.25 30.44 -      Physical Exam Constitutional:      Appearance: She is well-developed.  HENT:     Head: Normocephalic.     Mouth/Throat:     Mouth: Mucous membranes are moist.  Neck:     Vascular: No JVD.  Cardiovascular:     Rate and Rhythm: Normal rate.     Heart sounds: Normal heart sounds. No murmur heard.   Pulmonary:     Effort: Pulmonary effort is normal.     Breath sounds: Normal breath sounds. No wheezing or rales.  Chest:     Chest wall: No tenderness.  Abdominal:     General: Bowel sounds are normal. There is no distension.     Palpations: Abdomen is soft. There is no mass.     Tenderness: There is no abdominal tenderness.  Musculoskeletal:        General: Normal range of motion.     Right lower leg: No edema.     Left lower leg: No edema.  Neurological:     Mental Status: She is alert and oriented to person, place, and time.  Psychiatric:        Mood and Affect: Mood normal.     CMP Latest Ref Rng & Units 09/14/2017 09/13/2017 07/04/2017  Glucose 70 - 99 mg/dL 74 86 88  BUN 6 - 20 mg/dL 8 5(L) 7  Creatinine 07/06/2017 - 1.00 mg/dL 8.52 7.78 2.42  Sodium 135 - 145 mmol/L 134(L) 135 139  Potassium 3.5 - 5.1 mmol/L 3.6 3.9 4.2  Chloride 98 - 111 mmol/L 103 103 102  CO2 22 - 32 mmol/L 23 22 22   Calcium 8.9 - 10.3 mg/dL 9.2 9.4 9.6  Total Protein 6.5 - 8.1 g/dL - 7.0 7.3  Total Bilirubin 0.3 - 1.2 mg/dL - 0.5 0.2  Alkaline Phos 38 - 126 U/L - 38 53  AST 15 - 41 U/L - 10(L) 9  ALT 0 - 44 U/L - 8 7    Lipid Panel     Component Value Date/Time   CHOL 183 03/30/2016 1058   TRIG 114 03/30/2016 1058   HDL 36 (L) 03/30/2016 1058   CHOLHDL 5.1 (H) 03/30/2016 1058   VLDL 23 03/30/2016 1058   LDLCALC 124 (H) 03/30/2016 1058    CBC    Component Value Date/Time   WBC 9.0 04/14/2018 1814   RBC 4.56 04/14/2018 1814   HGB 12.8 04/14/2018 1814   HGB 12.1 07/04/2017 1001   HCT 40.8 04/14/2018 1814   HCT 38.4 07/04/2017 1001   PLT 130 (L) 04/14/2018 1814    PLT 199 07/04/2017 1001   MCV 89.5 04/14/2018 1814   MCV 89 07/04/2017 1001   MCH 28.1 04/14/2018 1814   MCHC 31.4 04/14/2018 1814   RDW 13.8 04/14/2018 1814   RDW 13.6 07/04/2017 1001   LYMPHSABS 1.6 09/14/2017 2042   LYMPHSABS 1.7 07/04/2017 1001   MONOABS 0.3 09/14/2017 2042   EOSABS 0.0 09/14/2017 2042   EOSABS 0.1 07/04/2017 1001   BASOSABS 0.0 09/14/2017 2042   BASOSABS 0.0 07/04/2017 1001    No results found for: HGBA1C  Assessment & Plan:  1. Tension headache Headcahes are infrequent Will place on Fioricet prn. If frequency increases she will need a medication for prophylaxis - butalbital-acetaminophen-caffeine (FIORICET) 50-325-40 MG tablet; Take 1-2 tablets by mouth every 12 (twelve) hours as needed for headache.  Dispense: 30 tablet; Refill: 1    Meds ordered this encounter  Medications  . butalbital-acetaminophen-caffeine (FIORICET) 50-325-40 MG tablet    Sig: Take 1-2 tablets by mouth every 12 (twelve) hours as needed for headache.    Dispense:  30 tablet    Refill:  1    Follow-up: Return in about 1 month (around 10/01/2019) for complete physical exam.       10/03/2019, MD, FAAFP. [facility] [place], [place]   08/31/2019, 10:08 AM

## 2019-08-31 NOTE — Patient Instructions (Signed)
Tension Headache, Adult A tension headache is pain, pressure, or aching in your head. Tension headaches can last from 30 minutes to several days. Follow these instructions at home: Managing pain  Take over-the-counter and prescription medicines only as told by your doctor.  When you have a headache, lie down in a dark, quiet room.  If told, put ice on your head and neck: ? Put ice in a plastic bag. ? Place a towel between your skin and the bag. ? Leave the ice on for 20 minutes, 2-3 times a day.  If told, put heat on the back of your neck. Do this as often as your doctor tells you to. Use the kind of heat that your doctor recommends, such as a moist heat pack or a heating pad. ? Place a towel between your skin and the heat. ? Leave the heat on for 20-30 minutes. ? Remove the heat if your skin turns bright red. Eating and drinking  Eat meals on a regular schedule.  Watch how much alcohol you drink: ? If you are a woman and are not pregnant, do not drink more than 1 drink a day. ? If you are a man, do not drink more than 2 drinks a day.  Drink enough fluid to keep your pee (urine) pale yellow.  Do not use a lot of caffeine, or stop using caffeine. Lifestyle  Get enough sleep. Get 7-9 hours of sleep each night. Or get the amount of sleep that your doctor tells you to.  At bedtime, remove all electronic devices from your room. Examples of electronic devices are computers, phones, and tablets.  Find ways to lessen your stress. Some things that can lessen stress are: ? Exercise. ? Deep breathing. ? Yoga. ? Music. ? Positive thoughts.  Sit up straight. Do not tighten (tense) your muscles.  Do not use any products that have nicotine or tobacco in them, such as cigarettes and e-cigarettes. If you need help quitting, ask your doctor. General instructions   Keep all follow-up visits as told by your doctor. This is important.  Avoid things that can bring on headaches. Keep a  journal to find out if certain things bring on headaches. For example, write down: ? What you eat and drink. ? How much sleep you get. ? Any change to your diet or medicines. Contact a doctor if:  Your headache does not get better.  Your headache comes back.  You have a headache and sounds, light, or smells bother you.  You feel sick to your stomach (nauseous) or you throw up (vomit).  Your stomach hurts. Get help right away if:  You suddenly get a very bad headache along with any of these: ? A stiff neck. ? Feeling sick to your stomach. ? Throwing up. ? Feeling weak. ? Trouble seeing. ? Feeling short of breath. ? A rash. ? Feeling unusually sleepy. ? Trouble speaking. ? Pain in your eye or ear. ? Trouble walking or balancing. ? Feeling like you will pass out (faint). ? Passing out. Summary  A tension headache is pain, pressure, or aching in your head.  Tension headaches can last from 30 minutes to several days.  Lifestyle changes and medicines may help relieve pain. This information is not intended to replace advice given to you by your health care provider. Make sure you discuss any questions you have with your health care provider. Document Revised: 10/22/2018 Document Reviewed: 04/06/2016 Elsevier Patient Education  2020 [employer] Inc.  

## 2019-09-03 ENCOUNTER — Encounter (HOSPITAL_COMMUNITY): Payer: Self-pay

## 2019-09-03 ENCOUNTER — Other Ambulatory Visit: Payer: Self-pay

## 2019-09-03 ENCOUNTER — Emergency Department (HOSPITAL_COMMUNITY)
Admission: EM | Admit: 2019-09-03 | Discharge: 2019-09-03 | Disposition: A | Discharge status: Home / Self Care | Payer: Medicaid Other | Attending: Emergency Medicine | Admitting: Emergency Medicine

## 2019-09-03 DIAGNOSIS — N12 Tubulo-interstitial nephritis, not specified as acute or chronic: Secondary | ICD-10-CM | POA: Insufficient documentation

## 2019-09-03 DIAGNOSIS — M545 Low back pain: Secondary | ICD-10-CM | POA: Diagnosis not present

## 2019-09-03 DIAGNOSIS — Z7722 Contact with and (suspected) exposure to environmental tobacco smoke (acute) (chronic): Secondary | ICD-10-CM | POA: Diagnosis not present

## 2019-09-03 DIAGNOSIS — R350 Frequency of micturition: Secondary | ICD-10-CM | POA: Diagnosis present

## 2019-09-03 DIAGNOSIS — N390 Urinary tract infection, site not specified: Secondary | ICD-10-CM

## 2019-09-03 LAB — CBC WITH DIFFERENTIAL/PLATELET
Abs Immature Granulocytes: 0.03 10*3/uL (ref 0.00–0.07)
Basophils Absolute: 0.1 10*3/uL (ref 0.0–0.1)
Basophils Relative: 1 %
Eosinophils Absolute: 0 10*3/uL (ref 0.0–0.5)
Eosinophils Relative: 0 %
HCT: 39.2 % (ref 36.0–46.0)
Hemoglobin: 12.4 g/dL (ref 12.0–15.0)
Immature Granulocytes: 0 %
Lymphocytes Relative: 16 %
Lymphs Abs: 1.4 10*3/uL (ref 0.7–4.0)
MCH: 28.1 pg (ref 26.0–34.0)
MCHC: 31.6 g/dL (ref 30.0–36.0)
MCV: 88.9 fL (ref 80.0–100.0)
Monocytes Absolute: 0.5 10*3/uL (ref 0.1–1.0)
Monocytes Relative: 6 %
Neutro Abs: 6.8 10*3/uL (ref 1.7–7.7)
Neutrophils Relative %: 77 %
Platelets: 195 10*3/uL (ref 150–400)
RBC: 4.41 MIL/uL (ref 3.87–5.11)
RDW: 13.2 % (ref 11.5–15.5)
WBC: 8.9 10*3/uL (ref 4.0–10.5)
nRBC: 0 % (ref 0.0–0.2)

## 2019-09-03 LAB — URINALYSIS, ROUTINE W REFLEX MICROSCOPIC
Bilirubin Urine: NEGATIVE
Glucose, UA: NEGATIVE mg/dL
Ketones, ur: 5 mg/dL — AB
Nitrite: NEGATIVE
Protein, ur: NEGATIVE mg/dL
Specific Gravity, Urine: 1.011 (ref 1.005–1.030)
WBC, UA: >50 WBC/hpf — ABNORMAL HIGH (ref 0–5)
pH: 6 (ref 5.0–8.0)

## 2019-09-03 LAB — BASIC METABOLIC PANEL
Anion gap: 11 (ref 5–15)
BUN: 8 mg/dL (ref 6–20)
CO2: 24 mmol/L (ref 22–32)
Calcium: 9.3 mg/dL (ref 8.9–10.3)
Chloride: 103 mmol/L (ref 98–111)
Creatinine, Ser: 0.82 mg/dL (ref 0.44–1.00)
GFR calc Af Amer: >60 mL/min (ref >60)
GFR calc non Af Amer: >60 mL/min (ref >60)
Glucose, Bld: 79 mg/dL (ref 70–99)
Potassium: 4 mmol/L (ref 3.5–5.1)
Sodium: 138 mmol/L (ref 135–145)

## 2019-09-03 LAB — PREGNANCY, URINE: Preg Test, Ur: NEGATIVE

## 2019-09-03 MED ORDER — KETOROLAC TROMETHAMINE 30 MG/ML IJ SOLN
60.0000 mg | Freq: Once | INTRAMUSCULAR | Status: AC
  Administered 2019-09-03: 60 mg via INTRAMUSCULAR
  Filled 2019-09-03: qty 2

## 2019-09-03 MED ORDER — SULFAMETHOXAZOLE-TRIMETHOPRIM 800-160 MG PO TABS: 1.0000 | ORAL_TABLET | Freq: Two times a day (BID) | ORAL | 0 refills | Status: AC

## 2019-09-03 NOTE — ED Provider Notes (Signed)
[name] EMERGENCY DEPARTMENT Provider Note   CSN: [id number] Arrival date & time: 09/03/19  1123     History Chief Complaint  Patient presents with  . Flank Pain  . Back Pain    Gabriela Jones is a 24 y.o. female who presents to the ED today with complaint of R sided lower back/flank pain that began 2 days ago. Pt also complains of urinary frequency and urgency. She has not taken anything for her symptoms. She denies any hx of recurrent UTIs. No hx of kidney stones. Denies fevers, chills, abdominal pain, nausea, vomiting, diarrhea, dysuria, vaginal discharge, pelvic pain, or any other associated symptoms.   The history is provided by the patient and medical records.       Past Medical History:  Diagnosis Date  . Medical history non-contributory     Patient Active Problem List   Diagnosis Date Noted  . Indication for care in labor or delivery 04/14/2018  . Acne vulgaris 03/30/2016    Past Surgical History:  Procedure Laterality Date  . TONSILLECTOMY       OB History    Gravida  1   Para  1   Term  1   Preterm      AB      Living  1     SAB      TAB      Ectopic      Multiple  0   Live Births  1           Family History  Problem Relation Age of Onset  . Hypertension Father     Social History   Tobacco Use  . Smoking status: Passive Smoke Exposure - Never Smoker  . Smokeless tobacco: Never Used  Vaping Use  . Vaping Use: Never used  Substance Use Topics  . Alcohol use: No  . Drug use: No    Home Medications Prior to Admission medications   Medication Sig Start Date End Date Taking? Authorizing Provider  butalbital-acetaminophen-caffeine (FIORICET) 50-325-40 MG tablet Take 1-2 tablets by mouth every 12 (twelve) hours as needed for headache. 08/31/19 08/30/20  Hoy Register, MD  sulfamethoxazole-trimethoprim (BACTRIM DS) 800-160 MG tablet Take 1 tablet by mouth 2 (two) times daily for 10 days. 09/03/19 09/13/19   Tanda Rockers, PA-C    Allergies    Patient has no known allergies.  Review of Systems   Review of Systems  Constitutional: Negative for chills and fever.  Gastrointestinal: Negative for abdominal pain, diarrhea, nausea and vomiting.  Genitourinary: Positive for flank pain, frequency and urgency. Negative for dysuria, pelvic pain and vaginal discharge.  All other systems reviewed and are negative.   Physical Exam Updated Vital Signs BP 102/63 (BP Location: Left Arm)   Pulse 92   Temp 98.2 F (36.8 C) (Oral)   Resp 16   SpO2 100%   Physical Exam Vitals and nursing note reviewed.  Constitutional:      Appearance: She is not ill-appearing or diaphoretic.  HENT:     Head: Normocephalic and atraumatic.  Eyes:     Conjunctiva/sclera: Conjunctivae normal.  Cardiovascular:     Rate and Rhythm: Normal rate and regular rhythm.     Pulses: Normal pulses.  Pulmonary:     Effort: Pulmonary effort is normal.     Breath sounds: Normal breath sounds. No wheezing, rhonchi or rales.  Abdominal:     Palpations: Abdomen is soft.     Tenderness: There is no  abdominal tenderness. There is right CVA tenderness. There is no left CVA tenderness, guarding or rebound.  Musculoskeletal:     Cervical back: Neck supple.  Skin:    General: Skin is warm and dry.  Neurological:     Mental Status: She is alert.     ED Results / Procedures / Treatments   Labs (all labs ordered are listed, but only abnormal results are displayed) Labs Reviewed  URINALYSIS, ROUTINE W REFLEX MICROSCOPIC - Abnormal; Notable for the following components:      Result Value   APPearance HAZY (*)    Hgb urine dipstick SMALL (*)    Ketones, ur 5 (*)    Leukocytes,Ua LARGE (*)    WBC, UA >50 (*)    Bacteria, UA RARE (*)    All other components within normal limits  URINE CULTURE  PREGNANCY, URINE  BASIC METABOLIC PANEL  CBC WITH DIFFERENTIAL/PLATELET    EKG None  Radiology No results  found.  Procedures Procedures (including critical care time)  Medications Ordered in ED Medications  ketorolac (TORADOL) 30 MG/ML injection 60 mg (has no administration in time range)    ED Course  I have reviewed the triage vital signs and the nursing notes.  Pertinent labs & imaging results that were available during my care of the patient were reviewed by me and considered in my medical decision making (see chart for details).    MDM Rules/Calculators/A&P                          24 year old otherwise healthy female who presents to the ED today complaining of right-sided back pain, urgency, frequency for the past 2 days.  No other symptoms.  On arrival to the ED patient is afebrile, nontachycardic nontachypneic.  She appears to be in no acute distress.  She does not have a history of recurrent UTIs nor kidney stones.  On exam patient has obvious right-sided CVA tenderness.  No abdominal tenderness.  Urinalysis was obtained while patient was in the waiting room with greater than 50 white blood cells per and positive leuks.  Preg negative.  CBC without leukocytosis.  BMP without electrolyte abnormalities and creatinine stable at 0.82.  Given urinary complaints as well as flank pain will treat empirically for pyelonephritis at this time.  I do not feel patient needs any imaging currently given she is so well-appearing and does not appear septic in nature.  Do not plan for admission.  She has no history of kidney stones and I doubt an infected stone at this time.  We will plan to discharge home with antibiotics and close PCP follow up. Pt provided toradol prior to discharge for pain.   This note was prepared using Dragon voice recognition software and may include unintentional dictation errors due to the inherent limitations of voice recognition software.  Final Clinical Impression(s) / ED Diagnoses Final diagnoses:  Pyelonephritis  Lower urinary tract infectious disease    Rx / DC  Orders ED Discharge Orders         Ordered    sulfamethoxazole-trimethoprim (BACTRIM DS) 800-160 MG tablet  2 times daily        09/03/19 1738           Discharge Instructions     Please pick up antibiotics and take as prescribed. You were found to have an infection in your urine today - we are treating you as if you have a kidney infection due  to your back pain.   We have sent your urine for culture and will call you in 2-3 days time if the antibiotic needs to be changed.  I would recommend Ibuprofen 600 mg every 6-8 hours as needed for pain and 1,000 mg Tylenol every 8 hours as needed for pain.  Follow up with your PCP next week for recheck.   Return to the ED IMMEDIATELY for any worsening symptoms including worsening pain, fevers > 100.4, excessive vomiting, or any other new/concerning symptoms       Tanda Rockers, PA-C 09/03/19 1740    Bethann Berkshire, MD 09/07/19 1048

## 2019-09-03 NOTE — Discharge Instructions (Signed)
Please pick up antibiotics and take as prescribed. You were found to have an infection in your urine today - we are treating you as if you have a kidney infection due to your back pain.   We have sent your urine for culture and will call you in 2-3 days time if the antibiotic needs to be changed.  I would recommend Ibuprofen 600 mg every 6-8 hours as needed for pain and 1,000 mg Tylenol every 8 hours as needed for pain.  Follow up with your PCP next week for recheck.   Return to the ED IMMEDIATELY for any worsening symptoms including worsening pain, fevers > 100.4, excessive vomiting, or any other new/concerning symptoms

## 2019-09-03 NOTE — ED Triage Notes (Signed)
Pt presents with bilateral flank pian and lower back pain x2 days. Denies any urinary symptoms or hx of kidney stones

## 2019-09-06 LAB — URINE CULTURE: Culture: >=100000 — AB

## 2019-09-07 ENCOUNTER — Telehealth: Payer: Self-pay | Admitting: *Deleted

## 2019-09-07 NOTE — Telephone Encounter (Signed)
Post ED Visit - Positive Culture Follow-up  Culture report reviewed by antimicrobial stewardship pharmacist: [facility] []  Nathan Batchelder, Pharm.D. []  , Pharm.D., BCPS AQ-ID []  , Pharm.D., BCPS []  Celedonio Miyamoto, .D., BCPS []  [place], .D., BCPS, AAHIVP []  Georgina Pillion, Pharm.D., BCPS, AAHIVP []  [address], PharmD, BCPS []  , PharmD, BCPS []  [place], PharmD, BCPS []  [address], PharmD []  , PharmD, BCPS []  Estella Husk, PharmD  Pharmacy Team []  Lysle Pearl, PharmD []  , PharmD []  Phillips Climes, PharmD []  , Rph []  Agapito Games) , PharmD []  Verlan Friends, PharmD []  , PharmD []  Mervyn Gay, PharmD []  , PharmD []  Vinnie Level, PharmD []  [facility], PharmD []  , PharmD []  Len Childs, PharmD   Positive urine culture Treated with Sulfamethoxazole-Trimethoprim, organism sensitive to the same and no further patient follow-up is required at this time. , PharmD  Greer Pickerel Talley 09/07/2019, 4:20 PM

## 2019-10-01 ENCOUNTER — Other Ambulatory Visit: Payer: Self-pay

## 2019-10-01 ENCOUNTER — Ambulatory Visit (INDEPENDENT_AMBULATORY_CARE_PROVIDER_SITE_OTHER): Payer: Medicaid Other | Admitting: Primary Care

## 2019-10-01 ENCOUNTER — Encounter (INDEPENDENT_AMBULATORY_CARE_PROVIDER_SITE_OTHER): Payer: Self-pay | Admitting: Primary Care

## 2019-10-01 VITALS — BP 104/67 | HR 86 | Temp 97.5°F | Ht 71.0 in | Wt 188.8 lb

## 2019-10-01 DIAGNOSIS — Z0000 Encounter for general adult medical examination without abnormal findings: Secondary | ICD-10-CM | POA: Diagnosis not present

## 2019-10-01 NOTE — Progress Notes (Signed)
[name] is a 24 y.o. female presents to office today for annual physical exam examination.    Concerns today include: 1. None  Occupation: studying , Marital status: Single, Substance use: no  Diet: regular , Exercise: several times a week Last eye exam: unknown  Last dental exam: unknown  Last pap smear: 2020 Refills needed today: none Immunizations needed: Flu Vaccine: no  Tdap Vaccine: no  - every 28yrs - (<3 lifetime doses or unknown): all wounds -- look up need for Tetanus IG - (>=3 lifetime doses): clean/minor wound if >69yrs from previous; all other wounds if >72yrs from previous Zoster Vaccine: no (those >50yo, once) Pneumonia Vaccine: no (those w/ risk factors) - (<63yr) Both: Immunocompromised, cochlear implant, CSF leak, asplenic, sickle cell, Chronic Renal Failure - (<38yr) PPSV-23 only: Heart dz, lung disease, DM, tobacco abuse, alcoholism, cirrhosis/liver disease. - (>47yr): PPSV13 then PPSV23 in 6-12mths;  - (>6yr): repeat PPSV23 once if pt received prior to 24yo and 31yrs have passed  Past Medical History:  Diagnosis Date  . Medical history non-contributory    Social History   Socioeconomic History  . Marital status: Single    Spouse name: Not on file  . Number of children: Not on file  . Years of education: Not on file  . Highest education level: Not on file  Occupational History  . Not on file  Tobacco Use  . Smoking status: Passive Smoke Exposure - Never Smoker  . Smokeless tobacco: Never Used  Vaping Use  . Vaping Use: Never used  Substance and Sexual Activity  . Alcohol use: No  . Drug use: No  . Sexual activity: Yes    Birth control/protection: Implant    Comment: Nexplanon placed in 2014 at [facility]  Other Topics Concern  . Not on file  Social History Narrative  . Not on file   Social Determinants of Health   Financial Resource Strain:   . Difficulty of Paying Living Expenses: Not on file  Food Insecurity:    . Worried About Programme researcher, broadcasting/film/video in the Last Year: Not on file  . Ran Out of Food in the Last Year: Not on file  Transportation Needs:   . Lack of Transportation (Medical): Not on file  . Lack of Transportation (Non-Medical): Not on file  Physical Activity:   . Days of Exercise per Week: Not on file  . Minutes of Exercise per Session: Not on file  Stress:   . Feeling of Stress : Not on file  Social Connections:   . Frequency of Communication with Friends and Family: Not on file  . Frequency of Social Gatherings with Friends and Family: Not on file  . Attends Religious Services: Not on file  . Active Member of Clubs or Organizations: Not on file  . Attends Banker Meetings: Not on file  . Marital Status: Not on file  Intimate Partner Violence:   . Fear of Current or Ex-Partner: Not on file  . Emotionally Abused: Not on file  . Physically Abused: Not on file  . Sexually Abused: Not on file   Past Surgical History:  Procedure Laterality Date  . TONSILLECTOMY     Family History  Problem Relation Age of Onset  . Hypertension Father     Current Outpatient Medications:  .  butalbital-acetaminophen-caffeine (FIORICET) 50-325-40 MG tablet, Take 1-2 tablets by mouth every 12 (twelve) hours as needed for headache., Disp: 30 tablet, Rfl: 1  No  Known Allergies   ROS: Review of Systems A comprehensive review of systems was negative.    Physical exam BP 104/67 (BP Location: Right Arm, Patient Position: Sitting, Cuff Size: Normal)   Pulse 86   Temp (!) 97.5 F (36.4 C) (Temporal)   Ht 5\' 11"  (1.803 m)   Wt 188 lb 12.8 oz (85.6 kg)   LMP 09/21/2019 (Approximate)   SpO2 99%   Breastfeeding No   BMI 26.33 kg/m   General: Vital signs reviewed.  Patient is well-developed and well-nourished, in no acute distress and cooperative with exam.  Head: Normocephalic and atraumatic. Eyes: EOMI, conjunctivae normal, no scleral icterus.  Neck: Supple, trachea midline,  normal ROM, no JVD, masses, thyromegaly, or carotid bruit present.  Cardiovascular: RRR, S1 normal, S2 normal, no murmurs, gallops, or rubs. Pulmonary/Chest: Clear to auscultation bilaterally, no wheezes, rales, or rhonchi. Abdominal: Soft, non-tender, non-distended, BS +, no masses, organomegaly, or guarding present.  Musculoskeletal: No joint deformities, erythema, or stiffness, ROM full and nontender. Extremities: No lower extremity edema bilaterally,  pulses symmetric and intact bilaterally. No cyanosis or clubbing. Neurological: A&O x3, Strength is normal and symmetric bilaterally, cranial nerve II-XII are grossly intact, no focal motor deficit, sensory intact to light touch bilaterally.  Skin: Warm, dry and intact. No rashes or erythema. Psychiatric: Normal mood and affect. speech and behavior is normal. Cognition and memory are normal.  Assessment/ Plan: 09/23/2019 here for annual physical exam.   No problem-specific Assessment & Plan notes found for this encounter.   Counseled on healthy lifestyle choices, including diet (rich in fruits, vegetables and lean meats and low in salt and simple carbohydrates) and exercise (at least 30 minutes of moderate physical activity daily).  Patient to follow up in 1 year for annual exam or sooner if needed.  The above assessment and management plan was discussed with the patient. The patient verbalized understanding of and has agreed to the management plan. Patient is aware to call the clinic if symptoms persist or worsen. Patient is aware when to return to the clinic for a follow-up visit. Patient educated on when it is appropriate to go to the emergency department.   Lynn Ito NP-C [address] [place] [phone]

## 2019-10-01 NOTE — Patient Instructions (Signed)
Health Maintenance, Female Adopting a healthy lifestyle and getting preventive care are important in promoting health and wellness. Ask your health care provider about:  The right schedule for you to have regular tests and exams.  Things you can do on your own to prevent diseases and keep yourself healthy. What should I know about diet, weight, and exercise? Eat a healthy diet   Eat a diet that includes plenty of vegetables, fruits, low-fat dairy products, and lean protein.  Do not eat a lot of foods that are high in solid fats, added sugars, or sodium. Maintain a healthy weight Body mass index (BMI) is used to identify weight problems. It estimates body fat based on height and weight. Your health care provider can help determine your BMI and help you achieve or maintain a healthy weight. Get regular exercise Get regular exercise. This is one of the most important things you can do for your health. Most adults should:  Exercise for at least 150 minutes each week. The exercise should increase your heart rate and make you sweat (moderate-intensity exercise).  Do strengthening exercises at least twice a week. This is in addition to the moderate-intensity exercise.  Spend less time sitting. Even light physical activity can be beneficial. Watch cholesterol and blood lipids Have your blood tested for lipids and cholesterol at 24 years of age, then have this test every 5 years. Have your cholesterol levels checked more often if:  Your lipid or cholesterol levels are high.  You are older than 24 years of age.  You are at high risk for heart disease. What should I know about cancer screening? Depending on your health history and family history, you may need to have cancer screening at various ages. This may include screening for:  Breast cancer.  Cervical cancer.  Colorectal cancer.  Skin cancer.  Lung cancer. What should I know about heart disease, diabetes, and high blood  pressure? Blood pressure and heart disease  High blood pressure causes heart disease and increases the risk of stroke. This is more likely to develop in people who have high blood pressure readings, are of African descent, or are overweight.  Have your blood pressure checked: ? Every 3-5 years if you are 18-39 years of age. ? Every year if you are 40 years old or older. Diabetes Have regular diabetes screenings. This checks your fasting blood sugar level. Have the screening done:  Once every three years after age 40 if you are at a normal weight and have a low risk for diabetes.  More often and at a younger age if you are overweight or have a high risk for diabetes. What should I know about preventing infection? Hepatitis B If you have a higher risk for hepatitis B, you should be screened for this virus. Talk with your health care provider to find out if you are at risk for hepatitis B infection. Hepatitis C Testing is recommended for:  Everyone born from 1945 through 1965.  Anyone with known risk factors for hepatitis C. Sexually transmitted infections (STIs)  Get screened for STIs, including gonorrhea and chlamydia, if: ? You are sexually active and are younger than 24 years of age. ? You are older than 24 years of age and your health care provider tells you that you are at risk for this type of infection. ? Your sexual activity has changed since you were last screened, and you are at increased risk for chlamydia or gonorrhea. Ask your health care provider if   you are at risk.  Ask your health care provider about whether you are at high risk for HIV. Your health care provider may recommend a prescription medicine to help prevent HIV infection. If you choose to take medicine to prevent HIV, you should first get tested for HIV. You should then be tested every 3 months for as long as you are taking the medicine. Pregnancy  If you are about to stop having your period (premenopausal) and  you may become pregnant, seek counseling before you get pregnant.  Take 400 to 800 micrograms (mcg) of folic acid every day if you become pregnant.  Ask for birth control (contraception) if you want to prevent pregnancy. Osteoporosis and menopause Osteoporosis is a disease in which the bones lose minerals and strength with aging. This can result in bone fractures. If you are 65 years old or older, or if you are at risk for osteoporosis and fractures, ask your health care provider if you should:  Be screened for bone loss.  Take a calcium or vitamin D supplement to lower your risk of fractures.  Be given hormone replacement therapy (HRT) to treat symptoms of menopause. Follow these instructions at home: Lifestyle  Do not use any products that contain nicotine or tobacco, such as cigarettes, e-cigarettes, and chewing tobacco. If you need help quitting, ask your health care provider.  Do not use street drugs.  Do not share needles.  Ask your health care provider for help if you need support or information about quitting drugs. Alcohol use  Do not drink alcohol if: ? Your health care provider tells you not to drink. ? You are pregnant, may be pregnant, or are planning to become pregnant.  If you drink alcohol: ? Limit how much you use to 0-1 drink a day. ? Limit intake if you are breastfeeding.  Be aware of how much alcohol is in your drink. In the [place] one drink equals one 12 oz bottle of beer (355 mL), one 5 oz glass of wine (148 mL), or one 1 oz glass of hard liquor (44 mL). General instructions  Schedule regular health, dental, and eye exams.  Stay current with your vaccines.  Tell your health care provider if: ? You often feel depressed. ? You have ever been abused or do not feel safe at home. Summary  Adopting a healthy lifestyle and getting preventive care are important in promoting health and wellness.  Follow your health care provider's instructions about healthy  diet, exercising, and getting tested or screened for diseases.  Follow your health care provider's instructions on monitoring your cholesterol and blood pressure. This information is not intended to replace advice given to you by your health care provider. Make sure you discuss any questions you have with your health care provider. Document Revised: 12/18/2017 Document Reviewed: 12/18/2017 Elsevier Patient Education  2020 [employer]  

## 2019-11-08 IMAGING — US US MFM OB COMP +14 WKS
1 series · 13 of 28 positions shown · non-contrast
Comparison: none

[Series 1: us mfm ob comp +14 wks · 87 acquisitions, 13 frames shown]
[im 4/87]
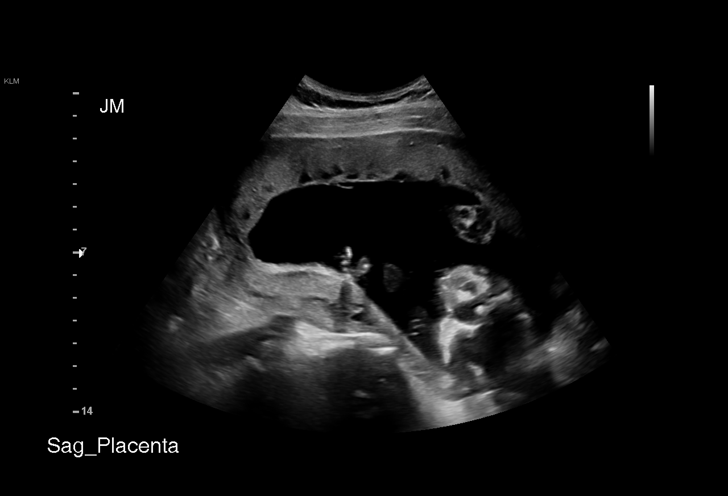
[im 10/87]
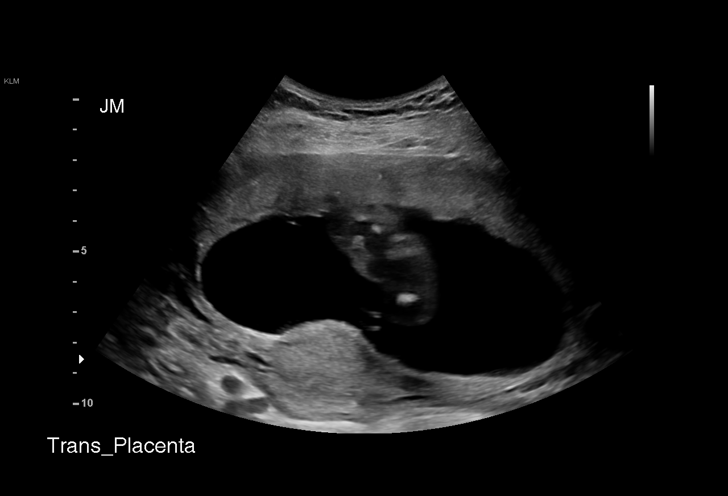
[im 16/87]
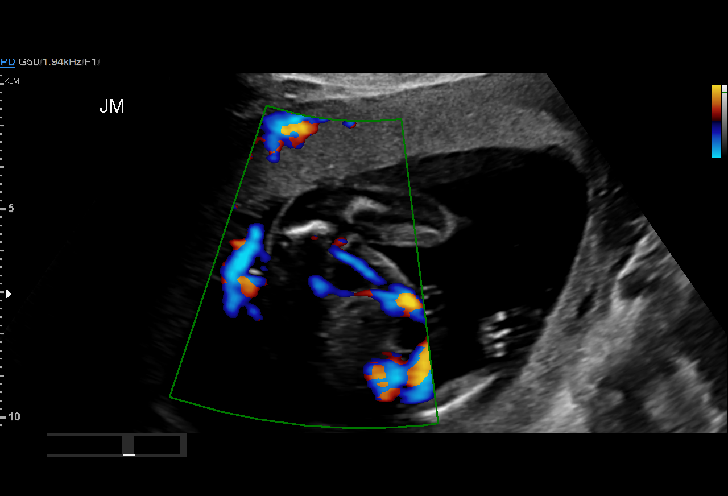
[im 23/87]
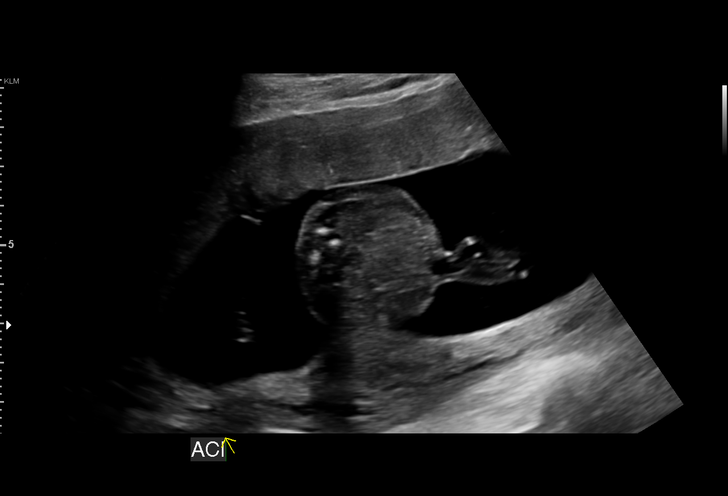
[im 29/87]
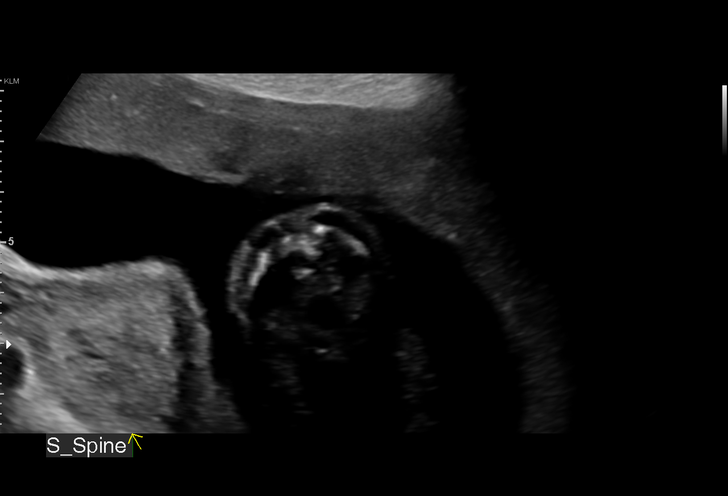
[im 36/87]
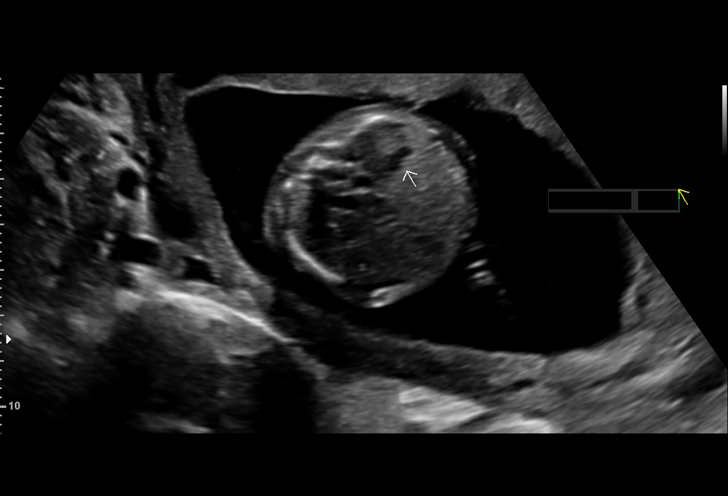
[im 45/87]
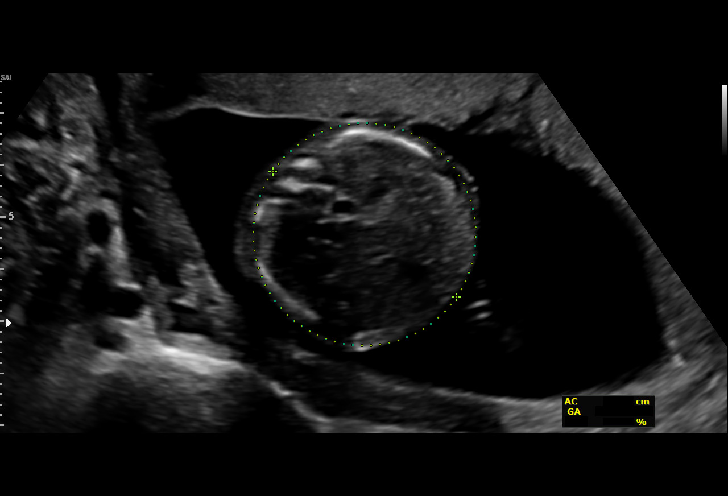
[im 51/87]
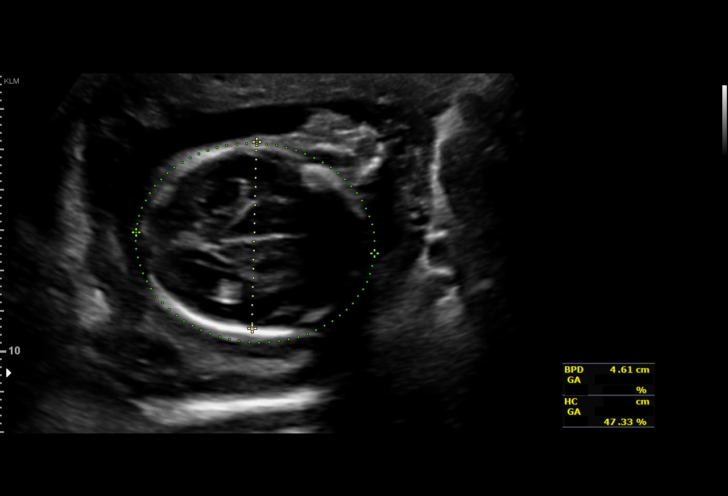
[im 58/87]
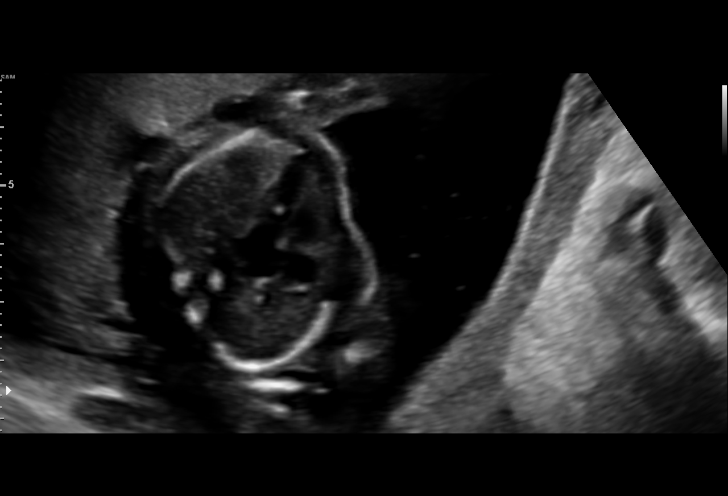
[im 64/87]
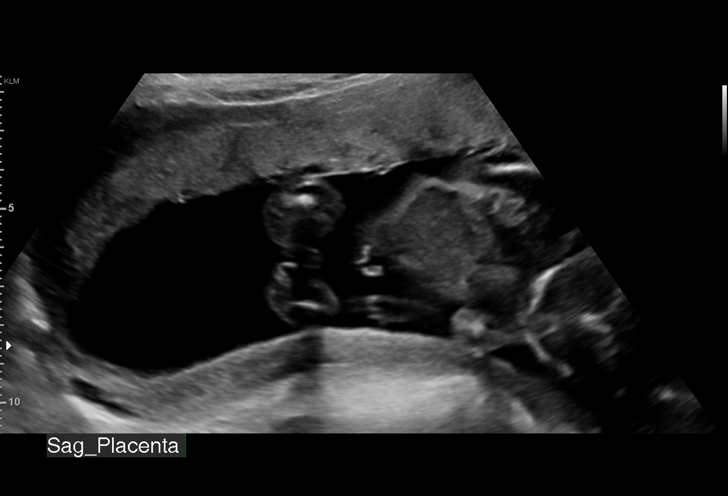
[im 71/87]
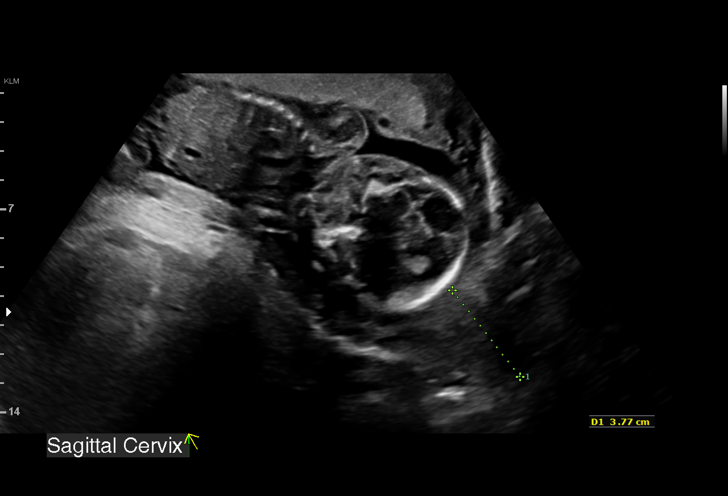
[im 77/87]
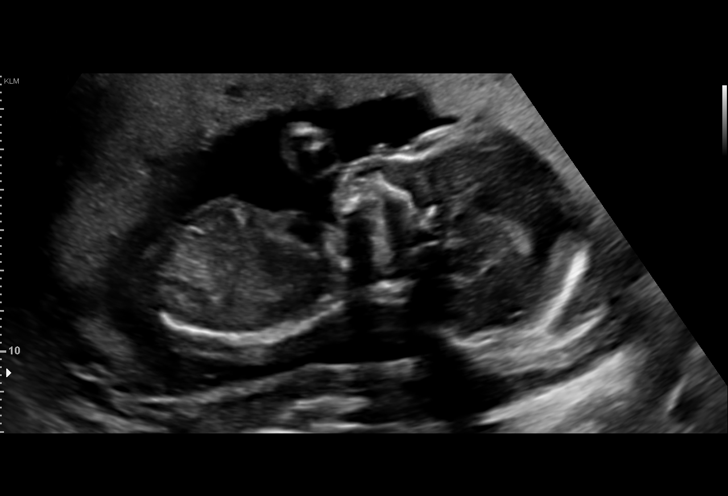
[im 83/87]
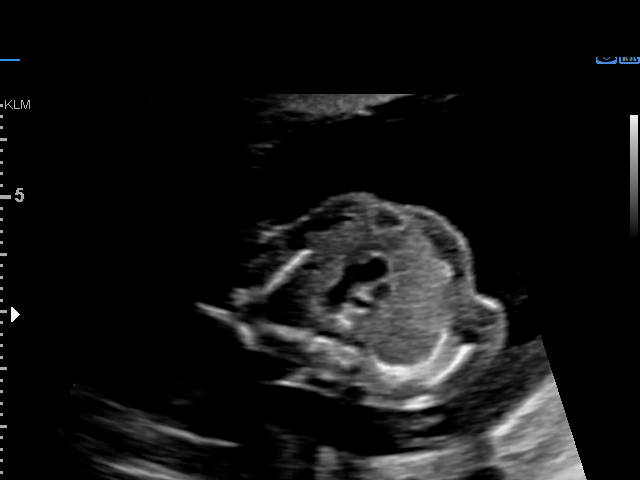

[13 of 28 positions shown; findings below may reference images not displayed]

Name:       VIKI-ACA BAR STARIBAR                  Visit Date: 11/27/2017 [REDACTED]

  1  US MFM OB COMP + 14 WK               76805.01     JOHANNES PAULUS MALANOG
 ----------------------------------------------------------------------

 ----------------------------------------------------------------------
Indications

  Encounter for antenatal screening for
  malformations
  19 weeks gestation of pregnancy
 ----------------------------------------------------------------------
Fetal Evaluation

 Num Of Fetuses:         1
 Fetal Heart Rate(bpm):  148
 Cardiac Activity:       Observed
 Presentation:           Cephalic
 Placenta:               Anterior
 P. Cord Insertion:      Visualized

 Amniotic Fluid
 AFI FV:      Within normal limits
Biometry

 BPD:      45.8  mm     G. Age:  19w 6d         62  %    CI:         74.6   %    70 - 86
                                                         FL/HC:      16.9   %    16.8 -
 HC:      168.3  mm     G. Age:  19w 4d         39  %    HC/AC:      1.24        1.09 -
 AC:      135.9  mm     G. Age:  19w 0d         28  %    FL/BPD:     62.2   %
 FL:       28.5  mm     G. Age:  18w 5d         17  %    FL/AC:      21.0   %    20 - 24
 HUM:      28.8  mm     G. Age:  19w 2d         46  %

 Est. FW:     268  gm      0 lb 9 oz     36  %
OB History

 Gravidity:    1         Term:   0        Prem:   0        SAB:   0
 TOP:          0       Ectopic:  0        Living: 0
Gestational Age

 U/S Today:     19w 2d                                        EDD:   [DATE]
 Best:          19w 4d     Det. By:  Early Ultrasound         EDD:   04/21/18
Anatomy

 Cranium:               Appears normal         Aortic Arch:            Appears normal
 Cavum:                 Appears normal         Ductal Arch:            Appears normal
 Ventricles:            Appears normal         Diaphragm:              Appears normal
 Choroid Plexus:        Appears normal         Stomach:                Not well visualized
 Cerebellum:            Appears normal         Abdomen:                Appears normal
 Posterior Fossa:       Appears normal         Abdominal Wall:         Appears nml (cord
                                                                       insert, abd wall)
 Nuchal Fold:           Appears normal         Cord Vessels:           Appears normal (3
                                                                       vessel cord)
 Face:                  Appears normal         Kidneys:                Appear normal
                        (orbits and profile)
 Lips:                  Appears normal         Bladder:                Appears normal
 Thoracic:              Appears normal         Spine:                  Appears normal
 Heart:                 Echogenic focus        Upper Extremities:      Appears normal
                        in LV
 RVOT:                  Appears normal         Lower Extremities:      Appears normal
 LVOT:                  Appears normal

 Other:  Parents do not wish to know sex of fetus. Heels visualized.
         Technically difficult due to fetal position.
Cervix Uterus Adnexa

 Cervix
 Length:            3.4  cm.
 Normal appearance by transabdominal scan.
Comments

 U/S images reviewed. Findings reviewed with patient.
 Appropriate fetal growth is noted.   An echogenic intracardiac
 focus is seen on today's U/S.  Stomach was not seen.  No
 other fetal abnormalities are seen.
 General counseling was performed regarding echogenic
 intracardiac focus/foci (EIF) was performed.  EIF is seen in 3-
 4% of normal fetuses in the second trimester.  The
 prevalence appears to be significantly higher in Asian
 populations.  The likelihood ratio of EIF for Trisomy 21 is
 estimated in the range of 1.8 - 4.2 (times the age related
 risk).  Multiple or large EIF may further increase the risk of
 aneuploidy. Patient's age related risk for Trisomy 21 with an
 EIF is 1/568.
 Patient desires NIPT.
 Esophageal atresia was discussed as well.

 Questions answered.
 15 minutes spent face to face with patient.
 Recommendations: 1)NIPT 2)  Follow-up U/S in 4 weeks for
 fetal growth and to re-evaluate fetal stomach

              Wal, Gislhaine

## 2019-11-24 ENCOUNTER — Other Ambulatory Visit: Payer: Self-pay

## 2019-11-24 ENCOUNTER — Emergency Department (HOSPITAL_COMMUNITY): Payer: Medicaid Other

## 2019-11-24 ENCOUNTER — Emergency Department (HOSPITAL_COMMUNITY)
Admission: EM | Admit: 2019-11-24 | Discharge: 2019-11-24 | Disposition: A | Discharge status: Home / Self Care | Payer: Medicaid Other | Attending: Emergency Medicine | Admitting: Emergency Medicine

## 2019-11-24 ENCOUNTER — Encounter (HOSPITAL_COMMUNITY): Payer: Self-pay | Admitting: Emergency Medicine

## 2019-11-24 DIAGNOSIS — S6991XA Unspecified injury of right wrist, hand and finger(s), initial encounter: Secondary | ICD-10-CM | POA: Diagnosis present

## 2019-11-24 DIAGNOSIS — W231XXA Caught, crushed, jammed, or pinched between stationary objects, initial encounter: Secondary | ICD-10-CM | POA: Insufficient documentation

## 2019-11-24 DIAGNOSIS — Y92810 Car as the place of occurrence of the external cause: Secondary | ICD-10-CM | POA: Insufficient documentation

## 2019-11-24 DIAGNOSIS — S60021A Contusion of right index finger without damage to nail, initial encounter: Secondary | ICD-10-CM | POA: Insufficient documentation

## 2019-11-24 DIAGNOSIS — Z7722 Contact with and (suspected) exposure to environmental tobacco smoke (acute) (chronic): Secondary | ICD-10-CM | POA: Insufficient documentation

## 2019-11-24 NOTE — ED Notes (Signed)
Patient verbalizes understanding of discharge instructions. Opportunity for questioning and answers were provided. Arm band removed by staff, patient discharged from ED. 

## 2019-11-24 NOTE — ED Provider Notes (Signed)
[name] EMERGENCY DEPARTMENT Provider Note   CSN: [id number] Arrival date & time: 11/24/19  1248     History Chief Complaint  Patient presents with  . Finger Injury    Gabriela Jones is a 24 y.o. female.  HPI   24 year old female who presents to the emergency department today for evaluation of finger pain.  She is complaining of pain to the right index finger after slamming it in a car door prior to arrival.  She has an artificial nail over her finger but does have some bruising to the nail bed underneath.    She did describe some numbness distal to the injury.  She denies any other injuries.  Past Medical History:  Diagnosis Date  . Medical history non-contributory     Patient Active Problem List   Diagnosis Date Noted  . Indication for care in labor or delivery 04/14/2018  . Acne vulgaris 03/30/2016    Past Surgical History:  Procedure Laterality Date  . TONSILLECTOMY       OB History    Gravida  1   Para  1   Term  1   Preterm      AB      Living  1     SAB      TAB      Ectopic      Multiple  0   Live Births  1           Family History  Problem Relation Age of Onset  . Hypertension Father     Social History   Tobacco Use  . Smoking status: Passive Smoke Exposure - Never Smoker  . Smokeless tobacco: Never Used  Vaping Use  . Vaping Use: Never used  Substance Use Topics  . Alcohol use: No  . Drug use: No    Home Medications Prior to Admission medications   Medication Sig Start Date End Date Taking? Authorizing Provider  butalbital-acetaminophen-caffeine (FIORICET) 50-325-40 MG tablet Take 1-2 tablets by mouth every 12 (twelve) hours as needed for headache. 08/31/19 08/30/20  Hoy Register, MD    Allergies    Patient has no known allergies.  Review of Systems   Review of Systems  Musculoskeletal:       Right index finger pain  Neurological: Positive for weakness.    Physical Exam Updated Vital  Signs BP 122/76 (BP Location: Right Arm)   Pulse 82   Temp 98.6 F (37 C) (Oral)   Resp 16   Ht 5\' 10"  (1.778 m)   Wt 81.6 kg   LMP 11/21/2019   SpO2 100%   BMI 25.83 kg/m   Physical Exam Vitals and nursing note reviewed.  Constitutional:      General: She is not in acute distress.    Appearance: She is well-developed.  HENT:     Head: Normocephalic and atraumatic.  Eyes:     Conjunctiva/sclera: Conjunctivae normal.  Cardiovascular:     Rate and Rhythm: Normal rate.  Pulmonary:     Effort: Pulmonary effort is normal.  Musculoskeletal:     Cervical back: Neck supple.     Comments: TTP to the right distal index finger. Small subungual hematoma with overlying artificial nail.   Skin:    General: Skin is warm and dry.  Neurological:     Mental Status: She is alert.     ED Results / Procedures / Treatments   Labs (all labs ordered are listed, but only abnormal  results are displayed) Labs Reviewed - No data to display  EKG None  Radiology DG Finger Index Right  Result Date: 11/24/2019 CLINICAL DATA:  Slammed index finger in car door. Pain and swelling. EXAM: RIGHT INDEX FINGER 2+V COMPARISON:  None. FINDINGS: The joint spaces are maintained. No acute fracture is identified. No radiopaque foreign body. IMPRESSION: No acute bony findings. Electronically Signed   By: Rudie Meyer M.D.   On: 11/24/2019 13:43    Procedures Procedures (including critical care time) SPLINT APPLICATION Date/Time: 2:16 PM Authorized by: Karrie Meres Consent: Verbal consent obtained. Risks and benefits: risks, benefits and alternatives were discussed Consent given by: patient Splint applied by: tech Location details: right index finger Splint type: aluminum finger splint Supplies used: as above Post-procedure: The splinted body part was neurovascularly unchanged following the procedure. Patient tolerance: Patient tolerated the procedure well with no immediate  complications.     Medications Ordered in ED Medications - No data to display  ED Course  I have reviewed the triage vital signs and the nursing notes.  Pertinent labs & imaging results that were available during my care of the patient were reviewed by me and considered in my medical decision making (see chart for details).    MDM Rules/Calculators/A&P                          24 year old female presenting for evaluation of crush injury to the distal right index finger.  Has mild sob ungual hematoma on exam with overlying artificial nail therefore this was not trephinated.  X-ray reviewed/interpreted and did not show any acute bony abnormalities or fracture.  Will splint for comfort.  Advised Tylenol/Motrin for symptoms and follow-up with PCP.  Advised on return precautions.  She voiced understanding of plan reasons to return.  All questions answered.  Patient stable for discharge.  Final Clinical Impression(s) / ED Diagnoses Final diagnoses:  Contusion of right index finger without damage to nail, initial encounter    Rx / DC Orders ED Discharge Orders    None       Karrie Meres, PA-C 11/24/19 1417    Melene Plan, DO 11/24/19 [phone]

## 2019-11-24 NOTE — Discharge Instructions (Signed)
Use splint for comfort  You may alternate taking Tylenol and Ibuprofen as needed for pain control. You may take 400-600 mg of ibuprofen every 6 hours and [phone] mg of Tylenol every 6 hours. Do not exceed 4000 mg of Tylenol daily as this can lead to liver damage. Also, make sure to take Ibuprofen with meals as it can cause an upset stomach. Do not take other NSAIDs while taking Ibuprofen such as (Aleve, Naprosyn, Aspirin, Celebrex, etc) and do not take more than the prescribed dose as this can lead to ulcers and bleeding in your GI tract. You may use warm and cold compresses to help with your symptoms.   Please follow up with your primary doctor within the next 7-10 days for re-evaluation and further treatment of your symptoms.   Please return to the ER sooner if you have any new or worsening symptoms.

## 2019-11-24 NOTE — ED Triage Notes (Signed)
Pt states she slammed her right index finger in the door. Finger has bruise below nail bed.

## 2019-12-06 IMAGING — US US MFM OB FOLLOW-UP
1 series · 14 of 28 positions shown · non-contrast
Comparison: none

[Series 1: us mfm ob follow-up · 32 acquisitions, 14 frames shown]
[im 2/32]
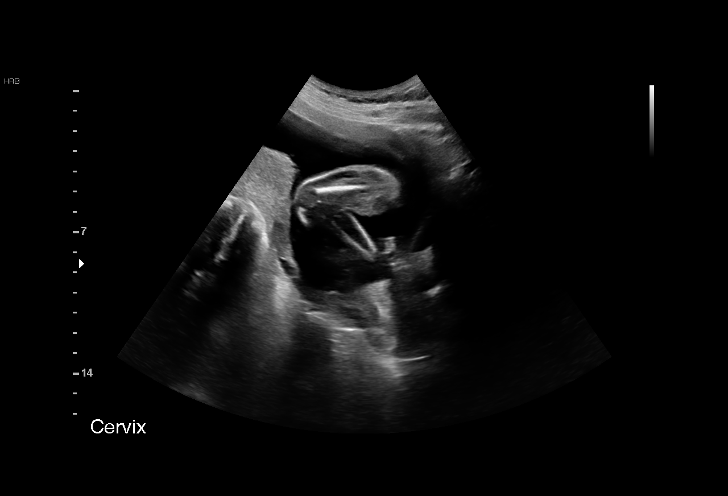
[im 4/32]
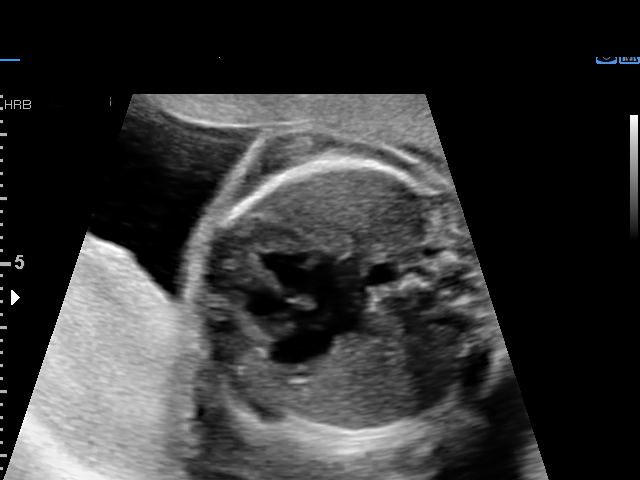
[im 6/32]
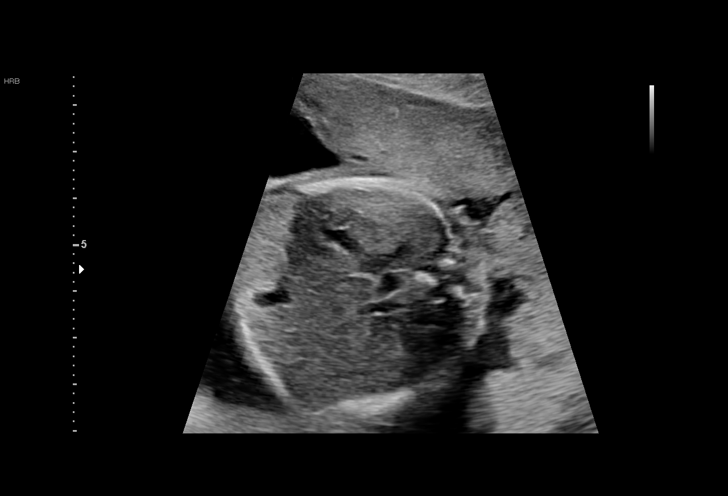
[im 9/32]
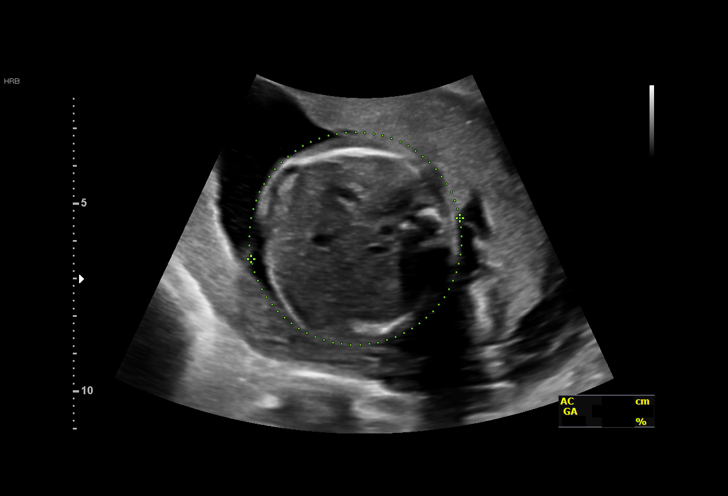
[im 11/32]
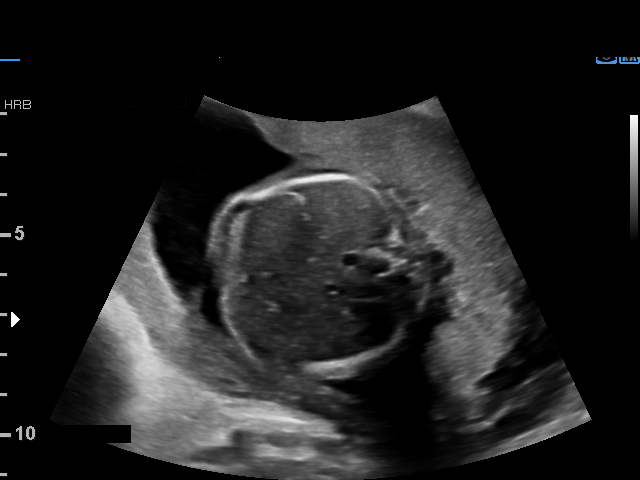
[im 13/32]
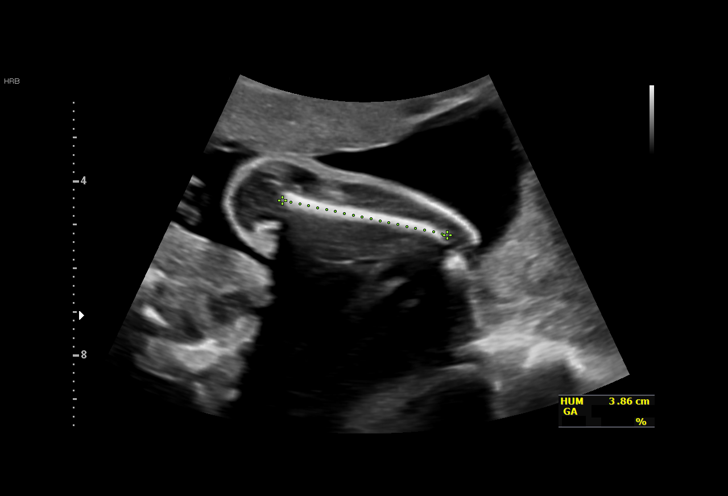
[im 15/32]
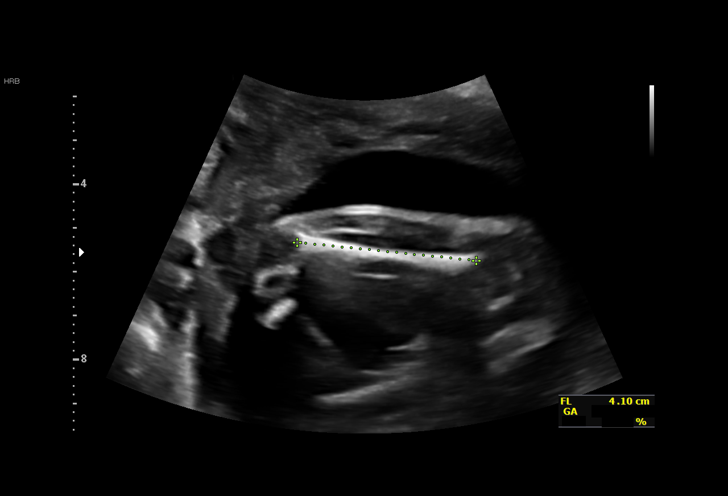
[im 18/32]
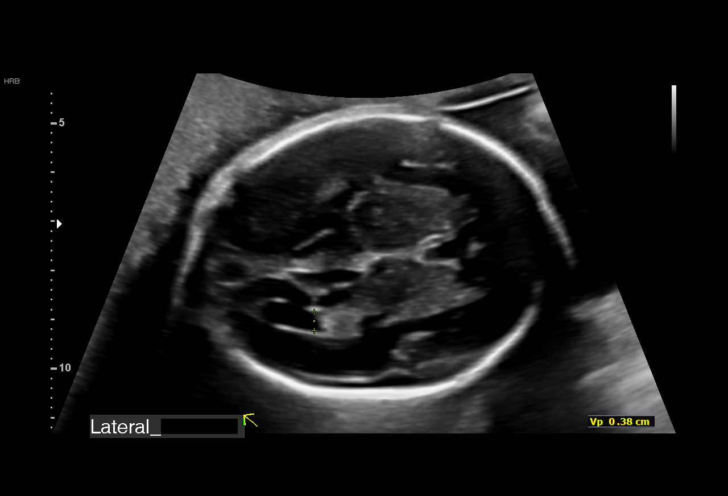
[im 20/32]
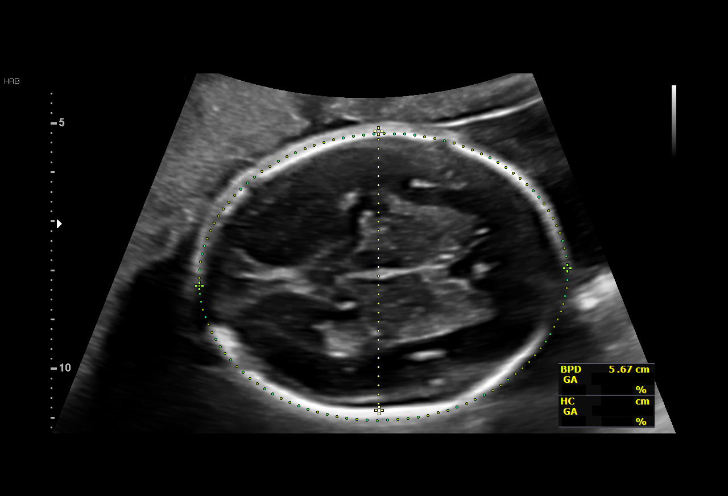
[im 22/32]
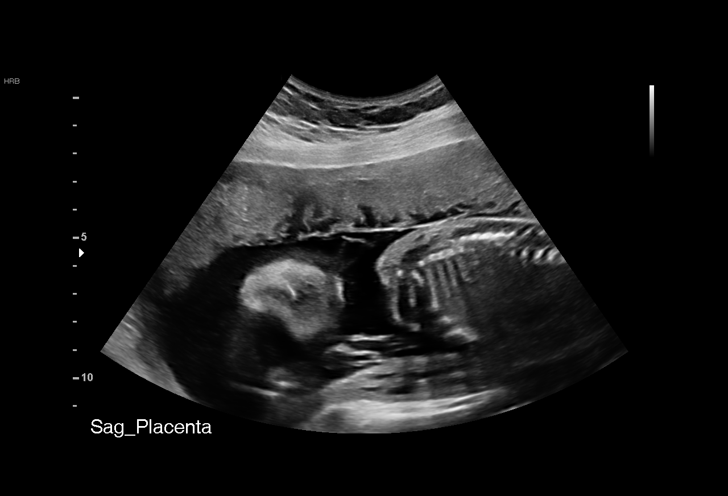
[im 25/32]
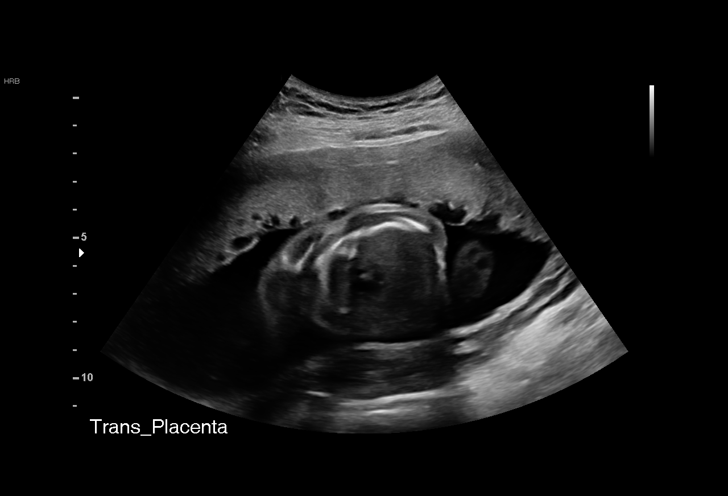
[im 27/32]
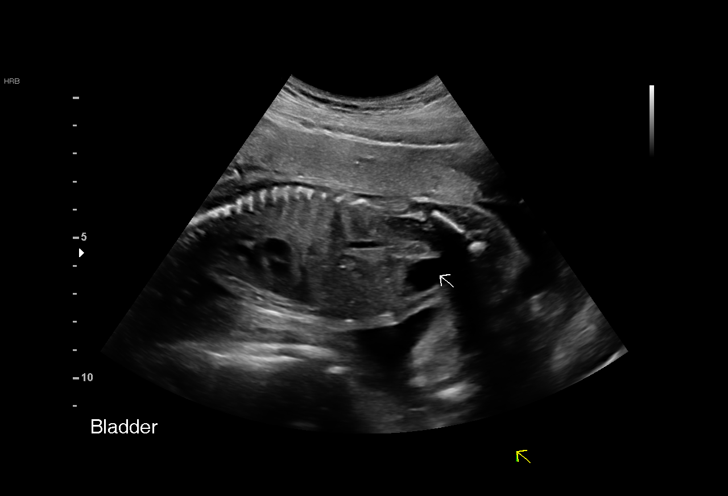
[im 29/32]
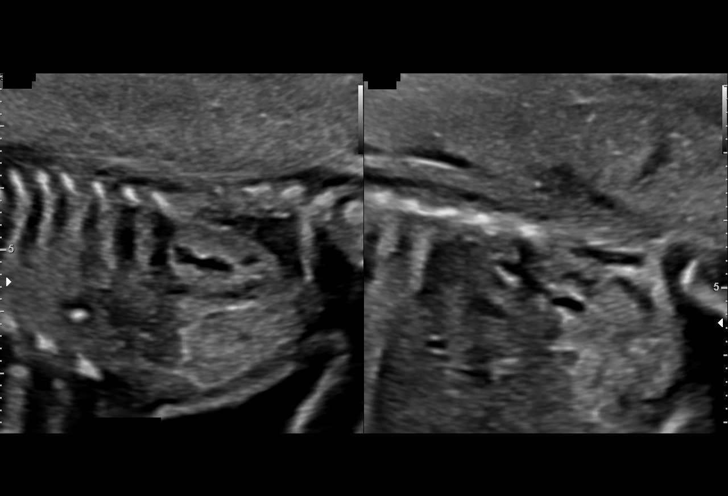
[im 32/32]
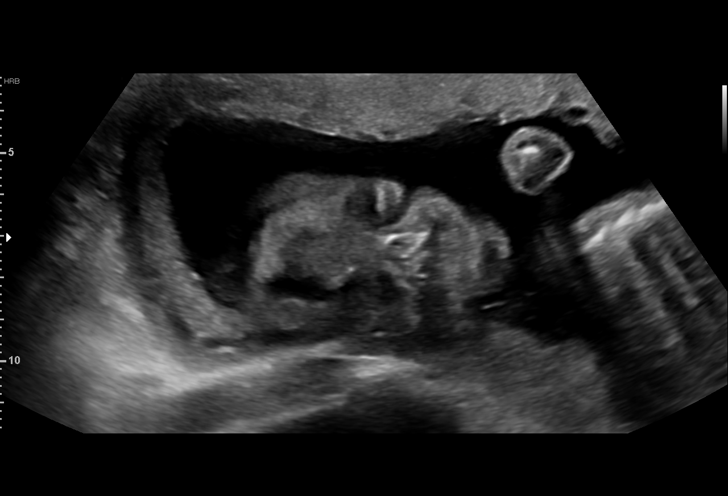

[14 of 28 positions shown; findings below may reference images not displayed]

----------------------------------------------------------------------

 ----------------------------------------------------------------------
Indications

  Encounter for antenatal screening for
  malformations
  23 weeks gestation of pregnancy
 ----------------------------------------------------------------------
Vital Signs

 BMI:
Fetal Evaluation

 Num Of Fetuses:         1
 Fetal Heart Rate(bpm):  145
 Cardiac Activity:       Observed
 Presentation:           Breech
 Placenta:               Anterior
 P. Cord Insertion:      Previously Visualized

 Amniotic Fluid
 AFI FV:      Within normal limits

                             Largest Pocket(cm)

Biometry

 BPD:      56.6  mm     G. Age:  23w 2d         34  %    CI:        72.87   %    70 - 86
                                                         FL/HC:      19.6   %    18.7 -
 HC:      210.8  mm     G. Age:  23w 1d         20  %    HC/AC:      1.19        1.05 -
 AC:      177.6  mm     G. Age:  22w 5d         17  %    FL/BPD:     73.0   %    71 - 87
 FL:       41.3  mm     G. Age:  23w 3d         33  %    FL/AC:      23.3   %    20 - 24
 HUM:      38.6  mm     G. Age:  23w 5d         44  %
 LV:        3.8  mm

 Est. FW:     555  gm      1 lb 4 oz     40  %
OB History

 Gravidity:    1         Term:   0        Prem:   0        SAB:   0
 TOP:          0       Ectopic:  0        Living: 0
Gestational Age

 U/S Today:     23w 1d                                        EDD:   04/22/18
 Best:          23w 4d     Det. By:  Early Ultrasound         EDD:   04/19/18
Anatomy

 Cranium:               Appears normal         Aortic Arch:            Previously seen
 Cavum:                 Appears normal         Ductal Arch:            Previously seen
 Ventricles:            Appears normal         Diaphragm:              Appears normal
 Choroid Plexus:        Previously seen        Stomach:                Appears normal, left
                                                                       sided
 Cerebellum:            Previously seen        Abdomen:                Appears normal
 Posterior Fossa:       Previously seen        Abdominal Wall:         Previously seen
 Nuchal Fold:           Previously seen        Cord Vessels:           Previously seen
 Face:                  Orbits and profile     Kidneys:                Appear normal
                        previously seen
 Lips:                  Previously seen        Bladder:                Appears normal
 Thoracic:              Appears normal         Spine:                  Previously seen
 Heart:                 Previously seen EIF    Upper Extremities:      Previously seen
 RVOT:                  Previously seen        Lower Extremities:      Previously seen
 LVOT:                  Previously seen

 Other:  Parents do not wish to know sex of fetus. Heels prev visualized.
         Technically difficult due to fetal position.
Cervix Uterus Adnexa

 Cervix
 Length:           3.24  cm.
 Normal appearance by transabdominal scan.
Comments

 U/S images reviewed. Findings reviewed with patient.
 Appropriate fetal growth is noted.  No fetal abnormalities are
 identified.  Fetal stomach is seen.  NIPT - low risk.
 Questions answered.
 10 minutes spent face to face with patient.
 Recommendations: 1) Follow-up as clinically indicated
Recommendations

  1) Follow-up as clinically indicated
              Oshana, Sohil

## 2020-02-02 ENCOUNTER — Other Ambulatory Visit: Payer: Self-pay

## 2020-02-02 ENCOUNTER — Encounter (HOSPITAL_COMMUNITY): Payer: Self-pay

## 2020-02-02 ENCOUNTER — Ambulatory Visit (HOSPITAL_COMMUNITY)
Admission: EM | Admit: 2020-02-02 | Discharge: 2020-02-02 | Disposition: A | Discharge status: Home / Self Care | Payer: Medicaid Other

## 2020-02-02 DIAGNOSIS — M79644 Pain in right finger(s): Secondary | ICD-10-CM | POA: Diagnosis not present

## 2020-02-02 DIAGNOSIS — S60121A Contusion of right index finger with damage to nail, initial encounter: Secondary | ICD-10-CM

## 2020-02-02 NOTE — ED Provider Notes (Signed)
[facility]    CSN: [id number] Arrival date & time: 02/02/20  1152      History   Chief Complaint Chief Complaint  Patient presents with  . Hand Pain    Right index finger    HPI Gabriela Jones is a 25 y.o. female.   Patient presents with ongoing pain in her right index finger and black underneath her fingernail since she injured it in November 2021.  She was seen in the ED on 11/24/2019; diagnosed with contusion of the right index finger; x-ray negative; she was noted to have a small subungual hematoma.  Patient denies new injury.  She denies fever, chills, numbness, weakness, paresthesias, redness, bruising, open wounds, or other symptoms.  The history is provided by the patient and medical records.    Past Medical History:  Diagnosis Date  . Medical history non-contributory     Patient Active Problem List   Diagnosis Date Noted  . Indication for care in labor or delivery 04/14/2018  . Acne vulgaris 03/30/2016    Past Surgical History:  Procedure Laterality Date  . TONSILLECTOMY      OB History    Gravida  1   Para  1   Term  1   Preterm      AB      Living  1     SAB      IAB      Ectopic      Multiple  0   Live Births  1            Home Medications    Prior to Admission medications   Medication Sig Start Date End Date Taking? Authorizing Provider  butalbital-acetaminophen-caffeine (FIORICET) 50-325-40 MG tablet Take 1-2 tablets by mouth every 12 (twelve) hours as needed for headache. 08/31/19 08/30/20  Hoy Register, MD    Family History Family History  Problem Relation Age of Onset  . Hypertension Father     Social History Social History   Tobacco Use  . Smoking status: Passive Smoke Exposure - Never Smoker  . Smokeless tobacco: Never Used  Vaping Use  . Vaping Use: Never used  Substance Use Topics  . Alcohol use: No  . Drug use: No     Allergies   Patient has no known allergies.   Review of  Systems Review of Systems  Constitutional: Negative for chills and fever.  HENT: Negative for ear pain and sore throat.   Eyes: Negative for pain and visual disturbance.  Respiratory: Negative for cough and shortness of breath.   Cardiovascular: Negative for chest pain and palpitations.  Gastrointestinal: Negative for abdominal pain and vomiting.  Genitourinary: Negative for dysuria and hematuria.  Musculoskeletal: Positive for arthralgias. Negative for back pain.  Skin: Negative for color change, rash and wound.  Neurological: Negative for seizures and syncope.  All other systems reviewed and are negative.    Physical Exam Triage Vital Signs ED Triage Vitals  Enc Vitals Group     BP      Pulse      Resp      Temp      Temp src      SpO2      Weight      Height      Head Circumference      Peak Flow      Pain Score      Pain Loc      Pain Edu?  Excl. in GC?    No data found.  Updated Vital Signs BP 106/63 (BP Location: Right Arm)   Pulse 98   Temp 99 F (37.2 C) (Oral)   Resp 18   LMP 01/04/2020 (Approximate)   SpO2 100%   Visual Acuity Right Eye Distance:   Left Eye Distance:   Bilateral Distance:    Right Eye Near:   Left Eye Near:    Bilateral Near:     Physical Exam Vitals and nursing note reviewed.  Constitutional:      General: She is not in acute distress.    Appearance: She is well-developed and well-nourished. She is not ill-appearing.  HENT:     Head: Normocephalic and atraumatic.     Mouth/Throat:     Mouth: Mucous membranes are moist.  Eyes:     Conjunctiva/sclera: Conjunctivae normal.  Cardiovascular:     Rate and Rhythm: Normal rate and regular rhythm.     Heart sounds: Normal heart sounds.  Pulmonary:     Effort: Pulmonary effort is normal. No respiratory distress.     Breath sounds: Normal breath sounds.  Abdominal:     Palpations: Abdomen is soft.     Tenderness: There is no abdominal tenderness.  Musculoskeletal:         General: No swelling, tenderness, deformity or edema. Normal range of motion.     Cervical back: Neck supple.  Skin:    General: Skin is warm and dry.     Capillary Refill: Capillary refill takes less than 2 seconds.     Findings: No bruising, erythema, lesion or rash.     Comments: Right index finger: FROM, sensation intact, strength 5/5. No erythema, wounds, bruising.  Subungual hematoma in center of nail.  Neurological:     General: No focal deficit present.     Mental Status: She is alert and oriented to person, place, and time.     Sensory: No sensory deficit.     Motor: No weakness.     Gait: Gait normal.  Psychiatric:        Mood and Affect: Mood and affect and mood normal.        Behavior: Behavior normal.      UC Treatments / Results  Labs (all labs ordered are listed, but only abnormal results are displayed) Labs Reviewed - No data to display  EKG   Radiology No results found.  Procedures Procedures (including critical care time)  Medications Ordered in UC Medications - No data to display  Initial Impression / Assessment and Plan / UC Course  I have reviewed the triage vital signs and the nursing notes.  Pertinent labs & imaging results that were available during my care of the patient were reviewed by me and considered in my medical decision making (see chart for details).   Right index finger pain.  Subungual hematoma of the right index finger.  Education provided to patient on subungual hematomas.  Discussed with her that she can take Tylenol or ibuprofen as needed for discomfort and follow-up with her PCP.  She agrees to plan of care.   Final Clinical Impressions(s) / UC Diagnoses   Final diagnoses:  Finger pain, right  Subungual hematoma of right index finger     Discharge Instructions     Take Tylenol or ibuprofen as needed for discomfort.    Follow up with your primary care provider if your symptoms are not improving.       ED  Prescriptions  None     PDMP not reviewed this encounter.   Mickie Bail, NP 02/02/20 1245

## 2020-02-02 NOTE — Discharge Instructions (Addendum)
Take Tylenol or ibuprofen as needed for discomfort.    Follow up with your primary care provider if your symptoms are not improving.    

## 2020-02-02 NOTE — ED Triage Notes (Signed)
Pt c/o right index finger pain. Pt states she smashed it on a door a year ago. She states last night her finger started throbbing. She states it is a sitting pain.

## 2020-09-20 ENCOUNTER — Emergency Department (HOSPITAL_COMMUNITY)
Admission: EM | Admit: 2020-09-20 | Discharge: 2020-09-20 | Disposition: A | Discharge status: Home / Self Care | Payer: Medicaid Other | Attending: Emergency Medicine | Admitting: Emergency Medicine

## 2020-09-20 ENCOUNTER — Encounter (HOSPITAL_COMMUNITY): Payer: Self-pay | Admitting: Emergency Medicine

## 2020-09-20 ENCOUNTER — Other Ambulatory Visit: Payer: Self-pay

## 2020-09-20 ENCOUNTER — Emergency Department (HOSPITAL_COMMUNITY): Payer: Medicaid Other

## 2020-09-20 DIAGNOSIS — X58XXXA Exposure to other specified factors, initial encounter: Secondary | ICD-10-CM | POA: Insufficient documentation

## 2020-09-20 DIAGNOSIS — Z7722 Contact with and (suspected) exposure to environmental tobacco smoke (acute) (chronic): Secondary | ICD-10-CM | POA: Diagnosis not present

## 2020-09-20 DIAGNOSIS — Y990 Civilian activity done for income or pay: Secondary | ICD-10-CM | POA: Insufficient documentation

## 2020-09-20 DIAGNOSIS — Y92129 Unspecified place in nursing home as the place of occurrence of the external cause: Secondary | ICD-10-CM | POA: Insufficient documentation

## 2020-09-20 DIAGNOSIS — S8392XA Sprain of unspecified site of left knee, initial encounter: Secondary | ICD-10-CM

## 2020-09-20 DIAGNOSIS — S8992XA Unspecified injury of left lower leg, initial encounter: Secondary | ICD-10-CM | POA: Diagnosis present

## 2020-09-20 MED ORDER — DICLOFENAC SODIUM 50 MG PO TBEC: 50.0000 mg | DELAYED_RELEASE_TABLET | Freq: Two times a day (BID) | ORAL | 0 refills | Status: AC

## 2020-09-20 NOTE — ED Triage Notes (Signed)
Pt states her left knee started hurting over the weekend. Pt is able to walk, but states it is swollen. Denies any fall or trauma.

## 2020-09-20 NOTE — ED Provider Notes (Signed)
Emergency Medicine Provider Triage Evaluation Note  Lynn Ito , a 25 y.o. female  was evaluated in triage.  Pt complains of left knee pain that started a few days. Tylenol and an ace wrap have not helped  Review of Systems  Positive: Left knee pain Negative: fever  Physical Exam  BP 119/70 (BP Location: Right Arm)   Pulse 85   Temp 99.1 F (37.3 C) (Oral)   Resp 14   SpO2 100%  Gen:   Awake, no distress   Resp:  Normal effort  MSK:   Moves extremities without difficulty  Other:  Ttp to the left anterior knee  Medical Decision Making  Medically screening exam initiated at 12:04 PM.  Appropriate orders placed.  Jazzmyn A Hasley was informed that the remainder of the evaluation will be completed by another provider, this initial triage assessment does not replace that evaluation, and the importance of remaining in the ED until their evaluation is complete.     Rayne Du 09/20/20 1206    Terrilee Files, MD 09/20/20 Jerene Bears

## 2020-09-20 NOTE — ED Provider Notes (Signed)
[name] EMERGENCY DEPARTMENT Provider Note   CSN: [id number] Arrival date & time: 09/20/20  1154     History Chief Complaint  Patient presents with   Knee Pain    Gabriela Jones is a 25 y.o. female.  25 year old female with complaint of left knee pain x 2-3 days, located around the patella without falls or injuries. 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 No history of STI. Has tried Tylenol with minimal relief.       Past Medical History:  Diagnosis Date   Medical history non-contributory     Patient Active Problem List   Diagnosis Date Noted   Indication for care in labor or delivery 04/14/2018   Acne vulgaris 03/30/2016    Past Surgical History:  Procedure Laterality Date   TONSILLECTOMY       OB History     Gravida  1   Para  1   Term  1   Preterm      AB      Living  1      SAB      IAB      Ectopic      Multiple  0   Live Births  1           Family History  Problem Relation Age of Onset   Hypertension Father     Social History   Tobacco Use   Smoking status: Passive Smoke Exposure - Never Smoker   Smokeless tobacco: Never  Vaping Use   Vaping Use: Never used  Substance Use Topics   Alcohol use: No   Drug use: No    Home Medications Prior to Admission medications   Medication Sig Start Date End Date Taking? Authorizing Provider  diclofenac (VOLTAREN) 50 MG EC tablet Take 1 tablet (50 mg total) by mouth 2 (two) times daily for 10 days. 09/20/20 09/30/20 Yes Gabriela Fend, PA-C    Allergies    Patient has no known allergies.  Review of Systems   Review of Systems  Constitutional:  Negative for fever.  Musculoskeletal:  Positive for arthralgias.  Skin:  Negative for color change, rash and wound.  Allergic/Immunologic: Negative for immunocompromised state.  Neurological:  Negative for weakness and numbness.  Hematological:  Does not bruise/bleed easily.   Psychiatric/Behavioral:  Negative for confusion.   All other systems reviewed and are negative.  Physical Exam Updated Vital Signs BP 119/70 (BP Location: Right Arm)   Pulse 85   Temp 99.1 F (37.3 C) (Oral)   Resp 14   LMP 09/19/2020   SpO2 100%   Physical Exam Vitals and nursing note reviewed.  Constitutional:      General: She is not in acute distress.    Appearance: She is well-developed. She is not diaphoretic.  HENT:     Head: Normocephalic and atraumatic.  Cardiovascular:     Pulses: Normal pulses.  Pulmonary:     Effort: Pulmonary effort is normal.  Musculoskeletal:        General: Swelling and tenderness present. No deformity or signs of injury.     Right knee: Normal.     Left knee: No swelling, deformity, effusion, erythema, ecchymosis, bony tenderness or crepitus. Normal range of motion. Tenderness present over the patellar tendon. No medial joint line, lateral joint line, MCL or LCL tenderness. No LCL laxity, MCL laxity, ACL laxity or PCL laxity.Normal pulse.  Right lower leg: No edema.     Left lower leg: No edema.     Comments: Able to straight leg raise without difficulty.   Skin:    General: Skin is warm and dry.     Findings: No erythema or rash.  Neurological:     Mental Status: She is alert and oriented to person, place, and time.  Psychiatric:        Behavior: Behavior normal.    ED Results / Procedures / Treatments   Labs (all labs ordered are listed, but only abnormal results are displayed) Labs Reviewed - No data to display  EKG None  Radiology DG Knee Complete 4 Views Left  Result Date: 09/20/2020 CLINICAL DATA:  Left knee pain.  No known injury. EXAM: LEFT KNEE - COMPLETE 4+ VIEW COMPARISON:  None. FINDINGS: No evidence of fracture, dislocation, or joint effusion. No evidence of arthropathy or other focal bone abnormality. Soft tissues are unremarkable. IMPRESSION: Negative. Electronically Signed   By: Charlett Nose M.D.   On:  09/20/2020 12:41    Procedures Procedures   Medications Ordered in ED Medications - No data to display  ED Course  I have reviewed the triage vital signs and the nursing notes.  Pertinent labs & imaging results that were available during my care of the patient were reviewed by me and considered in my medical decision making (see chart for details).  Clinical Course as of 09/20/20 1432  Tue Sep 20, 2020  [id number] 25 year old female with complaint of left knee pain without injury, thought to be related to being on her feet more often with a new job.  On exam, answers the anterior left knee, no erythema, no significant effusion.  No ligamentous laxity.  X-ray is unremarkable.  Recommend course of NSAIDs, ice, follow-up with PCP in 1 week if not improving. [LM]    Clinical Course User Index [LM] Gabriela Jones   MDM Rules/Calculators/A&P                           Final Clinical Impression(s) / ED Diagnoses Final diagnoses:  Sprain of left knee, unspecified ligament, initial encounter    Rx / DC Orders ED Discharge Orders          Ordered    diclofenac (VOLTAREN) 50 MG EC tablet  2 times daily        09/20/20 1429             Gabriela Fend, PA-C 09/20/20 1432    Gabriela Hong, MD 09/23/20 [id number]

## 2020-09-20 NOTE — Discharge Instructions (Addendum)
Take Diclofenac as prescribed for pain. Apply ice for 20 minutes at a time.  Follow up with your doctor if not improving in 1 week.

## 2020-10-21 ENCOUNTER — Other Ambulatory Visit: Payer: Self-pay

## 2020-10-21 ENCOUNTER — Emergency Department (HOSPITAL_COMMUNITY): Payer: Medicaid Other

## 2020-10-21 ENCOUNTER — Emergency Department (HOSPITAL_COMMUNITY)
Admission: EM | Admit: 2020-10-21 | Discharge: 2020-10-21 | Disposition: A | Discharge status: Home / Self Care | Payer: Medicaid Other | Attending: Emergency Medicine | Admitting: Emergency Medicine

## 2020-10-21 ENCOUNTER — Encounter (HOSPITAL_COMMUNITY): Payer: Self-pay | Admitting: Emergency Medicine

## 2020-10-21 DIAGNOSIS — J069 Acute upper respiratory infection, unspecified: Secondary | ICD-10-CM

## 2020-10-21 DIAGNOSIS — R42 Dizziness and giddiness: Secondary | ICD-10-CM | POA: Diagnosis present

## 2020-10-21 DIAGNOSIS — Z20822 Contact with and (suspected) exposure to covid-19: Secondary | ICD-10-CM | POA: Diagnosis not present

## 2020-10-21 DIAGNOSIS — R1110 Vomiting, unspecified: Secondary | ICD-10-CM | POA: Insufficient documentation

## 2020-10-21 DIAGNOSIS — Z7722 Contact with and (suspected) exposure to environmental tobacco smoke (acute) (chronic): Secondary | ICD-10-CM | POA: Diagnosis not present

## 2020-10-21 LAB — COMPREHENSIVE METABOLIC PANEL
ALT: 11 U/L (ref 0–44)
AST: 12 U/L — ABNORMAL LOW (ref 15–41)
Albumin: 4.3 g/dL (ref 3.5–5.0)
Alkaline Phosphatase: 44 U/L (ref 38–126)
Anion gap: 8 (ref 5–15)
BUN: 5 mg/dL — ABNORMAL LOW (ref 6–20)
CO2: 25 mmol/L (ref 22–32)
Calcium: 9.6 mg/dL (ref 8.9–10.3)
Chloride: 104 mmol/L (ref 98–111)
Creatinine, Ser: 0.92 mg/dL (ref 0.44–1.00)
GFR, Estimated: >60 mL/min (ref >60)
Glucose, Bld: 99 mg/dL (ref 70–99)
Potassium: 4 mmol/L (ref 3.5–5.1)
Sodium: 137 mmol/L (ref 135–145)
Total Bilirubin: 0.4 mg/dL (ref 0.3–1.2)
Total Protein: 7.7 g/dL (ref 6.5–8.1)

## 2020-10-21 LAB — CBC WITH DIFFERENTIAL/PLATELET
Abs Immature Granulocytes: 0.02 10*3/uL (ref 0.00–0.07)
Basophils Absolute: 0 10*3/uL (ref 0.0–0.1)
Basophils Relative: 0 %
Eosinophils Absolute: 0.1 10*3/uL (ref 0.0–0.5)
Eosinophils Relative: 1 %
HCT: 40.9 % (ref 36.0–46.0)
Hemoglobin: 13.1 g/dL (ref 12.0–15.0)
Immature Granulocytes: 0 %
Lymphocytes Relative: 18 %
Lymphs Abs: 1.2 10*3/uL (ref 0.7–4.0)
MCH: 28.5 pg (ref 26.0–34.0)
MCHC: 32 g/dL (ref 30.0–36.0)
MCV: 88.9 fL (ref 80.0–100.0)
Monocytes Absolute: 0.4 10*3/uL (ref 0.1–1.0)
Monocytes Relative: 6 %
Neutro Abs: 5.2 10*3/uL (ref 1.7–7.7)
Neutrophils Relative %: 75 %
Platelets: 200 10*3/uL (ref 150–400)
RBC: 4.6 MIL/uL (ref 3.87–5.11)
RDW: 13.2 % (ref 11.5–15.5)
WBC: 6.9 10*3/uL (ref 4.0–10.5)
nRBC: 0 % (ref 0.0–0.2)

## 2020-10-21 LAB — URINALYSIS, ROUTINE W REFLEX MICROSCOPIC
Bilirubin Urine: NEGATIVE
Glucose, UA: NEGATIVE mg/dL
Hgb urine dipstick: NEGATIVE
Ketones, ur: NEGATIVE mg/dL
Leukocytes,Ua: NEGATIVE
Nitrite: NEGATIVE
Protein, ur: NEGATIVE mg/dL
Specific Gravity, Urine: 1.002 — ABNORMAL LOW (ref 1.005–1.030)
pH: 7 (ref 5.0–8.0)

## 2020-10-21 LAB — I-STAT BETA HCG BLOOD, ED (MC, WL, AP ONLY): I-stat hCG, quantitative: <5 m[IU]/mL (ref <5)

## 2020-10-21 LAB — LIPASE, BLOOD: Lipase: 31 U/L (ref 11–51)

## 2020-10-21 LAB — RESP PANEL BY RT-PCR (FLU A&B, COVID) ARPGX2
Influenza A by PCR: NEGATIVE
Influenza B by PCR: NEGATIVE
SARS Coronavirus 2 by RT PCR: NEGATIVE

## 2020-10-21 NOTE — ED Provider Notes (Signed)
Emergency Medicine Provider Triage Evaluation Note  Gabriela Jones , a 25 y.o. female  was evaluated in triage.  Pt complains of dizziness.  Review of Systems  Positive: Dizzy, vomiting, congestion, cough, cold sxs Negative: Headache, cp, abd pain, dysuria  Physical Exam  BP 133/80   Pulse (!) 110   Temp 99.2 F (37.3 C) (Oral)   Resp 16   Ht 5\' 11"  (1.803 m)   Wt 77.1 kg   SpO2 100%   BMI 23.71 kg/m  Gen:   Awake, no distress   Resp:  Normal effort  MSK:   Moves extremities without difficulty  Other:    Medical Decision Making  Medically screening exam initiated at 2:50 PM.  Appropriate orders placed.  Alta A Spainhower was informed that the remainder of the evaluation will be completed by another provider, this initial triage assessment does not replace that evaluation, and the importance of remaining in the ED until their evaluation is complete.  PT report feeling dizzy since yesterday, felt nauseous today and vomit once. Also endorse congestion, cough.  Have been vaccinated for COVID.  LMP earlier this month.   Lynnea Ferrier, PA-C 10/21/20 1458    10/23/20, MD 10/27/20 1100

## 2020-10-21 NOTE — Discharge Instructions (Addendum)
You were seen in the emergency department for feeling dizzy lightheaded.  You had lab work done that was unremarkable.  Your chest x-ray did not show any pneumonia.  You have a COVID test that is pending at time of discharge.  You can follow this up in MyChart.  If you were COVID-positive you will need to isolate for up to 10 days after the beginning of your symptoms.

## 2020-10-21 NOTE — ED Triage Notes (Signed)
Pt endorses dizziness since yesterday. One episode of emesis this morning. Denies any pain or SOB.

## 2020-10-21 NOTE — ED Provider Notes (Signed)
[name] EMERGENCY DEPARTMENT Provider Note   CSN: [id number] Arrival date & time: 10/21/20  1436     History Chief Complaint  Patient presents with   Dizziness    Gabriela Jones is a 25 y.o. female.  She is here with a complaint of feeling dizzy lightheaded when she stands too long.  This is going going on for a few days.  Also endorses cold symptoms which is nasal congestion and a cough.  She denies any chest pain abdominal pain vomiting diarrhea or urinary symptoms.  No fevers.  Has tried nothing for it.  The history is provided by the patient.  Dizziness Quality:  Lightheadedness Severity:  Moderate Onset quality:  Gradual Duration:  2 days Timing:  Intermittent Progression:  Unchanged Chronicity:  New Context: standing up   Relieved by:  None tried Worsened by:  Standing up Ineffective treatments:  None tried Associated symptoms: no chest pain, no diarrhea, no headaches, no shortness of breath and no vision changes       Past Medical History:  Diagnosis Date   Medical history non-contributory     Patient Active Problem List   Diagnosis Date Noted   Indication for care in labor or delivery 04/14/2018   Acne vulgaris 03/30/2016    Past Surgical History:  Procedure Laterality Date   TONSILLECTOMY       OB History     Gravida  1   Para  1   Term  1   Preterm      AB      Living  1      SAB      IAB      Ectopic      Multiple  0   Live Births  1           Family History  Problem Relation Age of Onset   Hypertension Father     Social History   Tobacco Use   Smoking status: Passive Smoke Exposure - Never Smoker   Smokeless tobacco: Never  Vaping Use   Vaping Use: Never used  Substance Use Topics   Alcohol use: No   Drug use: No    Home Medications Prior to Admission medications   Not on File    Allergies    Patient has no known allergies.  Review of Systems   Review of Systems   Constitutional:  Negative for fever.  HENT:  Positive for rhinorrhea. Negative for sore throat.   Eyes:  Negative for visual disturbance.  Respiratory:  Positive for cough. Negative for shortness of breath.   Cardiovascular:  Negative for chest pain.  Gastrointestinal:  Negative for abdominal pain and diarrhea.  Genitourinary:  Negative for dysuria.  Musculoskeletal:  Negative for neck pain.  Skin:  Negative for rash.  Neurological:  Positive for dizziness and light-headedness. Negative for headaches.   Physical Exam Updated Vital Signs BP 130/70 (BP Location: Right Arm)   Pulse (!) 111   Temp 98.2 F (36.8 C) (Oral)   Resp 16   Ht 5\' 11"  (1.803 m)   Wt 77.1 kg   SpO2 100%   BMI 23.71 kg/m   Physical Exam Vitals and nursing note reviewed.  Constitutional:      General: She is not in acute distress.    Appearance: Normal appearance. She is well-developed.  HENT:     Head: Normocephalic and atraumatic.  Eyes:     Extraocular Movements: Extraocular movements intact.  Conjunctiva/sclera: Conjunctivae normal.     Pupils: Pupils are equal, round, and reactive to light.  Cardiovascular:     Rate and Rhythm: Normal rate and regular rhythm.     Heart sounds: No murmur heard. Pulmonary:     Effort: Pulmonary effort is normal. No respiratory distress.     Breath sounds: Normal breath sounds.  Abdominal:     Palpations: Abdomen is soft.     Tenderness: There is no abdominal tenderness.  Musculoskeletal:        General: No deformity or signs of injury. Normal range of motion.     Cervical back: Neck supple.  Skin:    General: Skin is warm and dry.  Neurological:     General: No focal deficit present.     Mental Status: She is alert.     Cranial Nerves: No cranial nerve deficit.     Sensory: No sensory deficit.     Motor: No weakness.    ED Results / Procedures / Treatments   Labs (all labs ordered are listed, but only abnormal results are displayed) Labs Reviewed   COMPREHENSIVE METABOLIC PANEL - Abnormal; Notable for the following components:      Result Value   BUN 5 (*)    AST 12 (*)    All other components within normal limits  URINALYSIS, ROUTINE W REFLEX MICROSCOPIC - Abnormal; Notable for the following components:   Color, Urine COLORLESS (*)    Specific Gravity, Urine 1.002 (*)    All other components within normal limits  RESP PANEL BY RT-PCR (FLU A&B, COVID) ARPGX2  CBC WITH DIFFERENTIAL/PLATELET  LIPASE, BLOOD  I-STAT BETA HCG BLOOD, ED ([facility], [facility], [facility] ONLY)    EKG None  Radiology DG Chest Port 1 View  Result Date: 10/21/2020 CLINICAL DATA:  Cough and dizziness.  One episode of emesis today EXAM: PORTABLE CHEST 1 VIEW COMPARISON:  February 05, 2017 FINDINGS: The heart size and mediastinal contours are within normal limits. Both lungs are clear. The visualized skeletal structures are unremarkable. IMPRESSION: No active disease. Electronically Signed   By: Maudry Mayhew M.D.   On: 10/21/2020 20:44    Procedures Procedures   Medications Ordered in ED Medications - No data to display  ED Course  I have reviewed the triage vital signs and the nursing notes.  Pertinent labs & imaging results that were available during my care of the patient were reviewed by me and considered in my medical decision making (see chart for details).  Clinical Course as of 10/22/20 1146  Fri Oct 21, 2020  2034 Chest x-ray ordered and interpreted by me as no acute infiltrates. [MB]    Clinical Course User Index [MB] Terrilee Files, MD   MDM Rules/Calculators/A&P                          Gabriela Jones was evaluated in Emergency Department on 10/21/2020 for the symptoms described in the history of present illness. She was evaluated in the context of the global COVID-19 pandemic, which necessitated consideration that the patient might be at risk for infection with the SARS-CoV-2 virus that causes COVID-19. Institutional protocols and algorithms that  pertain to the evaluation of patients at risk for COVID-19 are in a state of rapid change based on information released by regulatory bodies including the CDC and federal and state organizations. These policies and algorithms were followed during the patient's care in the ED. This patient  complains of URI symptoms lightheadedness; this involves an extensive number of treatment Options and is a complaint that carries with it a high risk of complications and Morbidity. The differential includes URI, dehydration, vertigo, metabolic derangement, COVID, flu  I ordered, reviewed and interpreted labs, which included CBC with normal white count normal hemoglobin, chemistries and LFTs normal, urinalysis without signs of infection, pregnancy test negative, COVID and flu negative I ordered imaging studies which included chest x-ray and I independently    visualized and interpreted imaging which showed no acute disease Previous records obtained and reviewed in epic no recent admissions  After the interventions stated above, I reevaluated the patient and found patient be hemodynamically stable and nontoxic-appearing.  Reviewed work-up with her.  Recommended symptomatic treatment at home and follow-up with primary care doctor.  Return instructions discussed   Final Clinical Impression(s) / ED Diagnoses Final diagnoses:  Lightheadedness  Upper respiratory tract infection, unspecified type  Person under investigation for COVID-19    Rx / DC Orders ED Discharge Orders     None        Terrilee Files, MD 10/22/20 1148

## 2020-11-08 ENCOUNTER — Emergency Department (HOSPITAL_COMMUNITY)
Admission: EM | Admit: 2020-11-08 | Discharge: 2020-11-08 | Disposition: A | Discharge status: Home / Self Care | Payer: Medicaid Other | Attending: Emergency Medicine | Admitting: Emergency Medicine

## 2020-11-08 ENCOUNTER — Other Ambulatory Visit: Payer: Self-pay

## 2020-11-08 DIAGNOSIS — S00502A Unspecified superficial injury of oral cavity, initial encounter: Secondary | ICD-10-CM | POA: Diagnosis present

## 2020-11-08 DIAGNOSIS — Z7722 Contact with and (suspected) exposure to environmental tobacco smoke (acute) (chronic): Secondary | ICD-10-CM | POA: Insufficient documentation

## 2020-11-08 DIAGNOSIS — X58XXXA Exposure to other specified factors, initial encounter: Secondary | ICD-10-CM | POA: Insufficient documentation

## 2020-11-08 DIAGNOSIS — S025XXA Fracture of tooth (traumatic), initial encounter for closed fracture: Secondary | ICD-10-CM | POA: Insufficient documentation

## 2020-11-08 MED ORDER — ACETAMINOPHEN 325 MG PO TABS
650.0000 mg | ORAL_TABLET | Freq: Once | ORAL | Status: AC | PRN
  Administered 2020-11-08: 650 mg via ORAL
  Filled 2020-11-08: qty 2

## 2020-11-08 NOTE — Discharge Instructions (Addendum)
You were seen evaluated in the emergency department today for for evaluation of tooth pain.  As we discussed, there is evidence of a broken tooth on the left lower side.  This is likely the source of your pain.  I have given you resources for various dentists in the area.  Please call around tomorrow and schedule an appointment as this will likely need further evaluation and possible tooth extraction.  Continue taking Tylenol/ibuprofen for pain.  Return to the emergency department sooner if you experience worsening pain, severe swelling, trouble talking, trouble swallowing, or any other concerns you might have.

## 2020-11-08 NOTE — ED Triage Notes (Signed)
Pt here POV with c/o left side dental pain X1 day. Pt states her tooth is chipped. Needs to find dentist that accepts her insurance.

## 2020-11-08 NOTE — ED Provider Notes (Signed)
[name] EMERGENCY DEPARTMENT Provider Note   CSN: [id number] Arrival date & time: 11/08/20  1637     History Chief Complaint  Patient presents with   Dental Pain    Gabriela Jones is a 25 y.o. female who presents to the emergency department with a 1 day history of dental pain.  She reports constant worsening dental pain localized to the left lower gumline.  She denies any fevers, chills, trouble swallowing, trouble talking, or swelling to the area.  She rates her dental pain moderate in severity.  She has not been taking anything for her pain.   Dental Pain     Past Medical History:  Diagnosis Date   Medical history non-contributory     Patient Active Problem List   Diagnosis Date Noted   Indication for care in labor or delivery 04/14/2018   Acne vulgaris 03/30/2016    Past Surgical History:  Procedure Laterality Date   TONSILLECTOMY       OB History     Gravida  1   Para  1   Term  1   Preterm      AB      Living  1      SAB      IAB      Ectopic      Multiple  0   Live Births  1           Family History  Problem Relation Age of Onset   Hypertension Father     Social History   Tobacco Use   Smoking status: Passive Smoke Exposure - Never Smoker   Smokeless tobacco: Never  Vaping Use   Vaping Use: Never used  Substance Use Topics   Alcohol use: No   Drug use: No    Home Medications Prior to Admission medications   Not on File    Allergies    Patient has no known allergies.  Review of Systems   Review of Systems  All other systems reviewed and are negative.  Physical Exam Updated Vital Signs BP 120/84 (BP Location: Right Arm)   Pulse 84   Temp 98.3 F (36.8 C) (Oral)   Resp 16   Ht 5\' 11"  (1.803 m)   Wt 63.5 kg   SpO2 98%   BMI 19.53 kg/m   Physical Exam Vitals and nursing note reviewed.  Constitutional:      Appearance: Normal appearance.  HENT:     Head: Normocephalic and  atraumatic.     Mouth/Throat:     Comments: There is evidence of broken tooth on the left lower first molar.  No evidence of surrounding erythema or swelling.  No obvious purulence.  Tongue is normal.  Tonsils are normal.  No pharyngeal edema or erythema.  Uvula is midline. Eyes:     General:        Right eye: No discharge.        Left eye: No discharge.     Conjunctiva/sclera: Conjunctivae normal.  Pulmonary:     Effort: Pulmonary effort is normal.  Skin:    General: Skin is warm and dry.     Findings: No rash.  Neurological:     General: No focal deficit present.     Mental Status: She is alert.  Psychiatric:        Mood and Affect: Mood normal.        Behavior: Behavior normal.    ED Results / Procedures /  Treatments   Labs (all labs ordered are listed, but only abnormal results are displayed) Labs Reviewed - No data to display  EKG None  Radiology No results found.  Procedures Procedures   Medications Ordered in ED Medications  acetaminophen (TYLENOL) tablet 650 mg (650 mg Oral Given 11/08/20 1818)    ED Course  I have reviewed the triage vital signs and the nursing notes.  Pertinent labs & imaging results that were available during my care of the patient were reviewed by me and considered in my medical decision making (see chart for details).    MDM Rules/Calculators/A&P                          Uchechi A Galvis is a 25 y.o. female presents to the emergency department for for evaluation of dental pain likely secondary to a broken tooth.  Patient is not immunocompromise and is otherwise well-appearing and afebrile.  She has a patent airway.  I have a low suspicion for deep space infection or obvious airway compromise at this time.  Patient physical exam and history I have low suspicion for RPA, PTA, Ludwick's angina, or periapical abscess at this time.  Instructed patient to continue taking Tylenol/ibuprofen on a scheduled basis until they follow-up with dentist.  I  will give her appropriate resources.  I will defer antibiotics at this time as there is no obvious infection.  Patient amenable this plan.  She is safe for discharge.   Final Clinical Impression(s) / ED Diagnoses Final diagnoses:  Closed fracture of tooth, initial encounter    Rx / DC Orders ED Discharge Orders     None        Teressa Lower, [place] 11/08/20 1826    Milagros Loll, MD 11/11/20 [phone]

## 2021-01-08 DIAGNOSIS — Z419 Encounter for procedure for purposes other than remedying health state, unspecified: Secondary | ICD-10-CM | POA: Diagnosis not present

## 2021-02-08 DIAGNOSIS — Z419 Encounter for procedure for purposes other than remedying health state, unspecified: Secondary | ICD-10-CM | POA: Diagnosis not present

## 2021-03-08 DIAGNOSIS — Z419 Encounter for procedure for purposes other than remedying health state, unspecified: Secondary | ICD-10-CM | POA: Diagnosis not present

## 2021-03-13 ENCOUNTER — Other Ambulatory Visit: Payer: Self-pay

## 2021-03-13 ENCOUNTER — Encounter (HOSPITAL_COMMUNITY): Payer: Self-pay | Admitting: *Deleted

## 2021-03-13 ENCOUNTER — Ambulatory Visit (HOSPITAL_COMMUNITY)
Admission: EM | Admit: 2021-03-13 | Discharge: 2021-03-13 | Disposition: A | Discharge status: Home / Self Care | Payer: Medicaid Other | Attending: Physician Assistant | Admitting: Physician Assistant

## 2021-03-13 DIAGNOSIS — S8992XA Unspecified injury of left lower leg, initial encounter: Secondary | ICD-10-CM | POA: Diagnosis not present

## 2021-03-13 DIAGNOSIS — S8392XA Sprain of unspecified site of left knee, initial encounter: Secondary | ICD-10-CM | POA: Diagnosis not present

## 2021-03-13 DIAGNOSIS — M25562 Pain in left knee: Secondary | ICD-10-CM | POA: Diagnosis not present

## 2021-03-13 MED ORDER — DICLOFENAC SODIUM 50 MG PO TBEC: 50.0000 mg | DELAYED_RELEASE_TABLET | Freq: Two times a day (BID) | ORAL | 0 refills | Status: AC

## 2021-03-13 NOTE — ED Triage Notes (Signed)
Pt reports having torn a ligament in the LT knee in the past. Pt reports lt has started to hurt again. ?

## 2021-03-13 NOTE — ED Provider Notes (Signed)
?[facility] ? ? ? ?CSN: [id number] ?Arrival date & time: 03/13/21  1913 ? ? ?  ? ?History   ?Chief Complaint ?Chief Complaint  ?Patient presents with  ? Knee Pain  ? ? ?HPI ?Gabriela Jones is a 26 y.o. female.  ? ?Patient presents today with 1 day history of recurrent left knee pain.  She denies any known injury or increase in activity prior to symptom onset.  Reports she was running with her child but did not have any fall when she developed pain.  Pain is localized to inferior joint line and described as aching.  She does report previous injury to her knee several months ago but has not seen orthopedic provider.  Denies previous surgery involving her knee.  Pain is rated 7 on a 0-10 pain scale, localized to inferior joint line, described as aching, no aggravating or alleviating factors identified.  She has not tried any over-the-counter medication for symptom management.  Denies any weakness, popping, clicking, instability. ? ? ?Past Medical History:  ?Diagnosis Date  ? Medical history non-contributory   ? ? ?Patient Active Problem List  ? Diagnosis Date Noted  ? Indication for care in labor or delivery 04/14/2018  ? Acne vulgaris 03/30/2016  ? ? ?Past Surgical History:  ?Procedure Laterality Date  ? TONSILLECTOMY    ? ? ?OB History   ? ? Gravida  ?1  ? Para  ?1  ? Term  ?1  ? Preterm  ?   ? AB  ?   ? Living  ?1  ?  ? ? SAB  ?   ? IAB  ?   ? Ectopic  ?   ? Multiple  ?0  ? Live Births  ?1  ?   ?  ?  ? ? ? ?Home Medications   ? ?Prior to Admission medications   ?Medication Sig Start Date End Date Taking? Authorizing Provider  ?diclofenac (VOLTAREN) 50 MG EC tablet Take 1 tablet (50 mg total) by mouth 2 (two) times daily. 03/13/21  Yes Daeshawn Redmann, Noberto Retort, PA-C  ? ? ?Family History ?Family History  ?Problem Relation Age of Onset  ? Hypertension Father   ? ? ?Social History ?Social History  ? ?Tobacco Use  ? Smoking status: Passive Smoke Exposure - Never Smoker  ? Smokeless tobacco: Never  ?Vaping Use  ? Vaping Use:  Never used  ?Substance Use Topics  ? Alcohol use: No  ? Drug use: No  ? ? ? ?Allergies   ?Patient has no known allergies. ? ? ?Review of Systems ?Review of Systems  ?Constitutional:  Positive for activity change. Negative for appetite change, fatigue and fever.  ?Respiratory:  Negative for cough and shortness of breath.   ?Cardiovascular:  Negative for chest pain.  ?Gastrointestinal:  Negative for abdominal pain, diarrhea, nausea and vomiting.  ?Musculoskeletal:  Positive for arthralgias and gait problem. Negative for joint swelling.  ?Neurological:  Negative for dizziness, weakness, light-headedness, numbness and headaches.  ? ? ?Physical Exam ?Triage Vital Signs ?ED Triage Vitals  ?Enc Vitals Group  ?   BP 03/13/21 2019 (!) 144/71  ?   Pulse Rate 03/13/21 2019 83  ?   Resp 03/13/21 2019 16  ?   Temp 03/13/21 2019 98.1 ?F (36.7 ?C)  ?   Temp src --   ?   SpO2 03/13/21 2019 97 %  ?   Weight --   ?   Height --   ?   Head Circumference --   ?  Peak Flow --   ?   Pain Score 03/13/21 2017 7  ?   Pain Loc --   ?   Pain Edu? --   ?   Excl. in GC? --   ? ?No data found. ? ?Updated Vital Signs ?BP (!) 144/71   Pulse 83   Temp 98.1 ?F (36.7 ?C)   Resp 16   LMP 02/17/2021   SpO2 97%  ? ?Visual Acuity ?Right Eye Distance:   ?Left Eye Distance:   ?Bilateral Distance:   ? ?Right Eye Near:   ?Left Eye Near:    ?Bilateral Near:    ? ?Physical Exam ?Vitals reviewed.  ?Constitutional:   ?   General: She is awake. She is not in acute distress. ?   Appearance: Normal appearance. She is well-developed. She is not ill-appearing.  ?   Comments: Very pleasant female appears stated age in no acute distress sitting comfortably in exam room  ?HENT:  ?   Head: Normocephalic and atraumatic.  ?Cardiovascular:  ?   Rate and Rhythm: Normal rate and regular rhythm.  ?   Heart sounds: Normal heart sounds, S1 normal and S2 normal. No murmur heard. ?Pulmonary:  ?   Effort: Pulmonary effort is normal.  ?   Breath sounds: Normal breath sounds. No  wheezing, rhonchi or rales.  ?   Comments: Clear to auscultation bilaterally ?Musculoskeletal:  ?   Left knee: No swelling or deformity. Decreased range of motion. Tenderness present over the medial joint line and lateral joint line. No LCL laxity, MCL laxity, ACL laxity or PCL laxity. ?   Right lower leg: No edema.  ?   Left lower leg: No edema.  ?   Comments: Left knee: Tenderness palpation along inferior joint line with mild effusion noted.  No ligamentous laxity on exam.  ?Psychiatric:     ?   Behavior: Behavior is cooperative.  ? ? ? ?UC Treatments / Results  ?Labs ?(all labs ordered are listed, but only abnormal results are displayed) ?Labs Reviewed - No data to display ? ?EKG ? ? ?Radiology ?No results found. ? ?Procedures ?Procedures (including critical care time) ? ?Medications Ordered in UC ?Medications - No data to display ? ?Initial Impression / Assessment and Plan / UC Course  ?I have reviewed the triage vital signs and the nursing notes. ? ?Pertinent labs & imaging results that were available during my care of the patient were reviewed by me and considered in my medical decision making (see chart for details). ? ?  ? ?No indication for x-ray given patient denies any recent trauma and has no specific bony tenderness.  Suspect knee sprain as etiology of symptoms.  She was placed in a knee brace and encouraged to use RICE protocol for symptom relief.  She was prescribed diclofenac for pain relief we discussed that she should not take NSAIDs with this medication due to risk of GI bleeding.  She is confident that she is not pregnant.  Given recurrent injury to left knee she was encouraged to follow-up with orthopedics for further evaluation and management.  Discussed alarm symptoms that warrant emergent evaluation including difficulty bearing weight, increased pain, significant swelling.  Strict return precautions given to which she expressed understanding. ? ?Final Clinical Impressions(s) / UC Diagnoses   ? ?Final diagnoses:  ?Sprain of left knee, unspecified ligament, initial encounter  ?Acute pain of left knee  ? ? ? ?Discharge Instructions   ? ?  ?Take diclofenac twice daily.  Do not take NSAIDs with this medication including aspirin, ibuprofen/Advil, naproxen/Aleve.  Keep your leg elevated and use compression and ice for additional symptom relief.  Avoid strenuous activity.  Given recurrent injury I recommend you follow-up with orthopedics.  Please call to schedule an appointment.  If you have any worsening symptoms including difficulty bearing weight, swelling, increased pain you need to be seen immediately. ? ? ? ? ?ED Prescriptions   ? ? Medication Sig Dispense Auth. Provider  ? diclofenac (VOLTAREN) 50 MG EC tablet Take 1 tablet (50 mg total) by mouth 2 (two) times daily. 20 tablet Jazari Ober K, PA-C  ? ?  ? ?PDMP not reviewed this encounter. ?  ?Jeani Hawking, PA-C ?03/13/21 2039 ? ?

## 2021-03-13 NOTE — Discharge Instructions (Signed)
Take diclofenac twice daily.  Do not take NSAIDs with this medication including aspirin, ibuprofen/Advil, naproxen/Aleve.  Keep your leg elevated and use compression and ice for additional symptom relief.  Avoid strenuous activity.  Given recurrent injury I recommend you follow-up with orthopedics.  Please call to schedule an appointment.  If you have any worsening symptoms including difficulty bearing weight, swelling, increased pain you need to be seen immediately. ?

## 2021-03-20 DIAGNOSIS — M222X2 Patellofemoral disorders, left knee: Secondary | ICD-10-CM | POA: Diagnosis not present

## 2021-03-20 DIAGNOSIS — M25562 Pain in left knee: Secondary | ICD-10-CM | POA: Diagnosis not present

## 2021-03-30 DIAGNOSIS — Z0000 Encounter for general adult medical examination without abnormal findings: Secondary | ICD-10-CM | POA: Diagnosis not present

## 2021-03-30 DIAGNOSIS — L700 Acne vulgaris: Secondary | ICD-10-CM | POA: Diagnosis not present

## 2021-04-08 DIAGNOSIS — Z419 Encounter for procedure for purposes other than remedying health state, unspecified: Secondary | ICD-10-CM | POA: Diagnosis not present

## 2021-05-08 DIAGNOSIS — Z419 Encounter for procedure for purposes other than remedying health state, unspecified: Secondary | ICD-10-CM | POA: Diagnosis not present

## 2021-05-24 ENCOUNTER — Other Ambulatory Visit: Payer: Self-pay

## 2021-05-24 ENCOUNTER — Encounter (HOSPITAL_COMMUNITY): Payer: Self-pay | Admitting: *Deleted

## 2021-05-24 ENCOUNTER — Emergency Department (HOSPITAL_COMMUNITY)
Admission: EM | Admit: 2021-05-24 | Discharge: 2021-05-24 | Disposition: A | Discharge status: Home / Self Care | Payer: Medicaid Other | Attending: Emergency Medicine | Admitting: Emergency Medicine

## 2021-05-24 DIAGNOSIS — M25561 Pain in right knee: Secondary | ICD-10-CM | POA: Insufficient documentation

## 2021-05-24 DIAGNOSIS — W228XXA Striking against or struck by other objects, initial encounter: Secondary | ICD-10-CM | POA: Diagnosis not present

## 2021-05-24 NOTE — ED Provider Notes (Signed)
?[place] ?Provider Note ? ? ?CSN: [id number] ?Arrival date & time: 05/24/21  2025 ? ?  ? ?History ? ?No chief complaint on file. ? ? ?Gabriela Jones is a 26 y.o. female. ? ?HPI ?Patient is a 26 year old female with no pertinent past medical history who presents emergency room today with complaints of "I think I bruised my knee "she states that she banged her right knee on a wooden table earlier today.  She was able to walk around afterwards and was feeling fine however she came to the emergency room because she was worried that she had a deep bruise.\No other injuries.  No bleeding or lacerations.  No other associate symptoms.  She has taken 1 dose of Tylenol and no other medications. ?  ? ?Home Medications ?Prior to Admission medications   ?Medication Sig Start Date End Date Taking? Authorizing Provider  ?diclofenac (VOLTAREN) 50 MG EC tablet Take 1 tablet (50 mg total) by mouth 2 (two) times daily. 03/13/21   Raspet, Derry Skill, PA-C  ?   ? ?Allergies    ?Patient has no known allergies.   ? ?Review of Systems   ?Review of Systems ? ?Physical Exam ?Updated Vital Signs ?BP 116/75 (BP Location: Right Arm)   Pulse (!) 102   Temp 99.1 ?F (37.3 ?C) (Oral)   Resp 16   LMP 05/17/2021   SpO2 100%  ?Physical Exam ?Vitals and nursing note reviewed.  ?Constitutional:   ?   General: She is not in acute distress. ?   Appearance: Normal appearance. She is not ill-appearing.  ?HENT:  ?   Head: Normocephalic and atraumatic.  ?Eyes:  ?   General: No scleral icterus.    ?   Right eye: No discharge.     ?   Left eye: No discharge.  ?   Conjunctiva/sclera: Conjunctivae normal.  ?Pulmonary:  ?   Effort: Pulmonary effort is normal.  ?   Breath sounds: No stridor.  ?Musculoskeletal:  ?   Comments: Full range of motion of right knee.  No lacerations or abrasions.  Not particularly tender to touch.  ?Neurological:  ?   Mental Status: She is alert and oriented to person, place, and time. Mental status  is at baseline.  ? ? ?ED Results / Procedures / Treatments   ?Labs ?(all labs ordered are listed, but only abnormal results are displayed) ?Labs Reviewed - No data to display ? ?EKG ?None ? ?Radiology ?No results found. ? ?Procedures ?Procedures  ? ? ?Medications Ordered in ED ?Medications - No data to display ? ?ED Course/ Medical Decision Making/ A&P ?  ?                        ?Medical Decision Making ? ?Patient is a 26 year old female presented emergency room today it would seem because she is concerned that she will suffer some long-term morbidity if a "deep bruise "is not diagnosed. ? ?The mechanism of her injury is quite mild.  Seems that she bumped her right knee against a wooden test.  No other injuries.  Overall I am reassured by her exam. ? ?No other injuries.  No abrasions lacerations or bleeding.  Will discharge home at this time. ? ? ?Final Clinical Impression(s) / ED Diagnoses ?Final diagnoses:  ?Acute pain of right knee  ? ? ?Rx / DC Orders ?ED Discharge Orders   ? ? None  ? ?  ? ? ?  ?  Tedd Sias, [place] ?05/24/21 2059 ? ?  ?Lorelle Gibbs, DO ?05/24/21 2237 ? ?

## 2021-05-24 NOTE — Discharge Instructions (Signed)
It seems that you have suffered a contusion of the knee.  Please put some ice on this and use Tylenol and ibuprofen as discussed below.  Please follow-up with your primary care doctor. ? ?Please use Tylenol or ibuprofen for pain.  You may use 600 mg ibuprofen every 6 hours or 1000 mg of Tylenol every 6 hours.  You may choose to alternate between the 2.  This would be most effective.  Not to exceed 4 g of Tylenol within 24 hours.  Not to exceed 3200 mg ibuprofen 24 hours.  ?

## 2021-05-24 NOTE — ED Triage Notes (Signed)
Pt states she ran into a wooden table yesterday, c/o right knee pain.  ?

## 2021-06-08 DIAGNOSIS — Z419 Encounter for procedure for purposes other than remedying health state, unspecified: Secondary | ICD-10-CM | POA: Diagnosis not present

## 2021-06-15 ENCOUNTER — Other Ambulatory Visit: Payer: Self-pay

## 2021-06-15 ENCOUNTER — Emergency Department (HOSPITAL_COMMUNITY)
Admission: EM | Admit: 2021-06-15 | Discharge: 2021-06-15 | Disposition: A | Discharge status: Home / Self Care | Payer: Medicaid Other | Attending: Emergency Medicine | Admitting: Emergency Medicine

## 2021-06-15 ENCOUNTER — Encounter (HOSPITAL_COMMUNITY): Payer: Self-pay | Admitting: Emergency Medicine

## 2021-06-15 DIAGNOSIS — R000 Tachycardia, unspecified: Secondary | ICD-10-CM | POA: Insufficient documentation

## 2021-06-15 DIAGNOSIS — R42 Dizziness and giddiness: Secondary | ICD-10-CM | POA: Insufficient documentation

## 2021-06-15 LAB — CBC WITH DIFFERENTIAL/PLATELET
Abs Immature Granulocytes: 0.01 10*3/uL (ref 0.00–0.07)
Basophils Absolute: 0.1 10*3/uL (ref 0.0–0.1)
Basophils Relative: 1 %
Eosinophils Absolute: 0.1 10*3/uL (ref 0.0–0.5)
Eosinophils Relative: 1 %
HCT: 39.3 % (ref 36.0–46.0)
Hemoglobin: 12.4 g/dL (ref 12.0–15.0)
Immature Granulocytes: 0 %
Lymphocytes Relative: 43 %
Lymphs Abs: 1.7 10*3/uL (ref 0.7–4.0)
MCH: 28.2 pg (ref 26.0–34.0)
MCHC: 31.6 g/dL (ref 30.0–36.0)
MCV: 89.3 fL (ref 80.0–100.0)
Monocytes Absolute: 0.2 10*3/uL (ref 0.1–1.0)
Monocytes Relative: 4 %
Neutro Abs: 2 10*3/uL (ref 1.7–7.7)
Neutrophils Relative %: 51 %
Platelets: 192 10*3/uL (ref 150–400)
RBC: 4.4 MIL/uL (ref 3.87–5.11)
RDW: 13.1 % (ref 11.5–15.5)
WBC: 4 10*3/uL (ref 4.0–10.5)
nRBC: 0 % (ref 0.0–0.2)

## 2021-06-15 LAB — I-STAT BETA HCG BLOOD, ED (MC, WL, AP ONLY): I-stat hCG, quantitative: <5 m[IU]/mL (ref <5)

## 2021-06-15 LAB — URINALYSIS, ROUTINE W REFLEX MICROSCOPIC
Bilirubin Urine: NEGATIVE
Glucose, UA: NEGATIVE mg/dL
Hgb urine dipstick: NEGATIVE
Ketones, ur: NEGATIVE mg/dL
Leukocytes,Ua: NEGATIVE
Nitrite: NEGATIVE
Protein, ur: NEGATIVE mg/dL
Specific Gravity, Urine: 1.004 — ABNORMAL LOW (ref 1.005–1.030)
pH: 6 (ref 5.0–8.0)

## 2021-06-15 LAB — COMPREHENSIVE METABOLIC PANEL
ALT: 10 U/L (ref 0–44)
AST: 10 U/L — ABNORMAL LOW (ref 15–41)
Albumin: 4.2 g/dL (ref 3.5–5.0)
Alkaline Phosphatase: 40 U/L (ref 38–126)
Anion gap: 9 (ref 5–15)
BUN: <5 mg/dL — ABNORMAL LOW (ref 6–20)
CO2: 26 mmol/L (ref 22–32)
Calcium: 9.3 mg/dL (ref 8.9–10.3)
Chloride: 104 mmol/L (ref 98–111)
Creatinine, Ser: 0.8 mg/dL (ref 0.44–1.00)
GFR, Estimated: >60 mL/min (ref >60)
Glucose, Bld: 103 mg/dL — ABNORMAL HIGH (ref 70–99)
Potassium: 3.8 mmol/L (ref 3.5–5.1)
Sodium: 139 mmol/L (ref 135–145)
Total Bilirubin: 0.4 mg/dL (ref 0.3–1.2)
Total Protein: 7.3 g/dL (ref 6.5–8.1)

## 2021-06-15 LAB — CBG MONITORING, ED: Glucose-Capillary: 100 mg/dL — ABNORMAL HIGH (ref 70–99)

## 2021-06-15 MED ORDER — MECLIZINE HCL 25 MG PO TABS
25.0000 mg | ORAL_TABLET | Freq: Once | ORAL | Status: AC
  Administered 2021-06-15: 25 mg via ORAL
  Filled 2021-06-15: qty 1

## 2021-06-15 MED ORDER — MECLIZINE HCL 25 MG PO TABS: 25.0000 mg | ORAL_TABLET | Freq: Three times a day (TID) | ORAL | 0 refills | Status: AC | PRN

## 2021-06-15 MED ORDER — ONDANSETRON 4 MG PO TBDP
4.0000 mg | ORAL_TABLET | Freq: Once | ORAL | Status: AC
  Administered 2021-06-15: 4 mg via ORAL
  Filled 2021-06-15: qty 1

## 2021-06-15 MED ORDER — ONDANSETRON 4 MG PO TBDP
4.0000 mg | ORAL_TABLET | Freq: Three times a day (TID) | ORAL | 0 refills | Status: AC | PRN
Start: 2021-06-15 — End: ?

## 2021-06-15 NOTE — ED Provider Notes (Signed)
[name] EMERGENCY DEPARTMENT Provider Note   CSN: [id number] Arrival date & time: 06/15/21  0947     History  Chief Complaint  Patient presents with   Dizziness    Gabriela Jones is a 26 y.o. female.  Pt is a 26 yo with no significant hx.  Pt said she woke up yesterday feeling dizzy.  Dizziness is worse when she turns her head.  She denies any head trauma.         Home Medications Prior to Admission medications   Medication Sig Start Date End Date Taking? Authorizing Provider  meclizine (ANTIVERT) 25 MG tablet Take 1 tablet (25 mg total) by mouth 3 (three) times daily as needed for dizziness. 06/15/21  Yes Jacalyn Lefevre, MD  ondansetron (ZOFRAN-ODT) 4 MG disintegrating tablet Take 1 tablet (4 mg total) by mouth every 8 (eight) hours as needed for nausea or vomiting. 06/15/21  Yes Jacalyn Lefevre, MD  diclofenac (VOLTAREN) 50 MG EC tablet Take 1 tablet (50 mg total) by mouth 2 (two) times daily. 03/13/21   Raspet, Noberto Retort, PA-C      Allergies    Patient has no known allergies.    Review of Systems   Review of Systems  Neurological:  Positive for dizziness.  All other systems reviewed and are negative.   Physical Exam Updated Vital Signs BP 140/75 (BP Location: Right Arm)   Pulse 97   Temp 98.5 F (36.9 C) (Oral)   Resp 16   LMP 05/17/2021   SpO2 100%  Physical Exam Vitals and nursing note reviewed.  Constitutional:      Appearance: Normal appearance.  HENT:     Head: Normocephalic and atraumatic.     Right Ear: External ear normal.     Left Ear: External ear normal.     Nose: Nose normal.     Mouth/Throat:     Mouth: Mucous membranes are moist.     Pharynx: Oropharynx is clear.  Eyes:     Extraocular Movements: Extraocular movements intact.     Conjunctiva/sclera: Conjunctivae normal.     Pupils: Pupils are equal, round, and reactive to light.  Cardiovascular:     Rate and Rhythm: Normal rate and regular rhythm.     Pulses: Normal  pulses.     Heart sounds: Normal heart sounds.  Pulmonary:     Effort: Pulmonary effort is normal.     Breath sounds: Normal breath sounds.  Abdominal:     General: Abdomen is flat. Bowel sounds are normal.     Palpations: Abdomen is soft.  Musculoskeletal:        General: Normal range of motion.     Cervical back: Normal range of motion and neck supple.  Skin:    General: Skin is warm.     Capillary Refill: Capillary refill takes less than 2 seconds.  Neurological:     General: No focal deficit present.     Mental Status: She is alert and oriented to person, place, and time.  Psychiatric:        Mood and Affect: Mood normal.        Behavior: Behavior normal.     ED Results / Procedures / Treatments   Labs (all labs ordered are listed, but only abnormal results are displayed) Labs Reviewed  COMPREHENSIVE METABOLIC PANEL - Abnormal; Notable for the following components:      Result Value   Glucose, Bld 103 (*)    BUN <5 (*)  AST 10 (*)    All other components within normal limits  URINALYSIS, ROUTINE W REFLEX MICROSCOPIC - Abnormal; Notable for the following components:   Color, Urine STRAW (*)    Specific Gravity, Urine 1.004 (*)    All other components within normal limits  CBG MONITORING, ED - Abnormal; Notable for the following components:   Glucose-Capillary 100 (*)    All other components within normal limits  CBC WITH DIFFERENTIAL/PLATELET  I-STAT BETA HCG BLOOD, ED ([facility], [facility], [facility] ONLY)    EKG EKG Interpretation  Date/Time:  Thursday June 15 2021 09:54:33 EDT Ventricular Rate:  103 PR Interval:  128 QRS Duration: 84 QT Interval:  360 QTC Calculation: 471 R Axis:   72 Text Interpretation: Sinus tachycardia Otherwise normal ECG When compared with ECG of 06-Feb-2019 12:33, PREVIOUS ECG IS PRESENT No significant change since last tracing Confirmed by Jacalyn Lefevre [phone]) on 06/15/2021 11:40:45 AM  Radiology No results found.  Procedures Procedures     Medications Ordered in ED Medications  meclizine (ANTIVERT) tablet 25 mg (25 mg Oral Given 06/15/21 1228)  ondansetron (ZOFRAN-ODT) disintegrating tablet 4 mg (4 mg Oral Given 06/15/21 1228)    ED Course/ Medical Decision Making/ A&P                           This patient presents to the ED for concern of dizziness, this involves an extensive number of treatment options, and is a complaint that carries with it a high risk of complications and morbidity.  The differential diagnosis includes BPV, electrolyte abn, pregnancy, dehydration   Co morbidities that complicate the patient evaluation  none   Additional history obtained:  Additional history obtained from epic chart review  Lab Tests:  I Ordered, and personally interpreted labs.  The pertinent results include:  cbc nl, cmp nl, ua nl, preg nl  Medicines ordered and prescription drug management:  I ordered medication including zofran and atarax  for dizziness  Reevaluation of the patient after these medicines showed that the patient improved I have reviewed the patients home medicines and have made adjustments as needed   Problem List / ED Course:  Dizziness:  likely due to BPV.  Pt is feeling better after meds.  She is d/c with antivert and zofran.  She is given instructions on how to do the Epley maneuver.  She is to return if worse.    Reevaluation:  After the interventions noted above, I reevaluated the patient and found that they have :improved   Social Determinants of Health:  Lives at home   Dispostion:  After consideration of the diagnostic results and the patients response to treatment, I feel that the patent would benefit from discharge with outpatient f/u.         Final Clinical Impression(s) / ED Diagnoses Final diagnoses:  Vertigo    Rx / DC Orders ED Discharge Orders          Ordered    ondansetron (ZOFRAN-ODT) 4 MG disintegrating tablet  Every 8 hours PRN        06/15/21 1242     meclizine (ANTIVERT) 25 MG tablet  3 times daily PRN        06/15/21 1242              Jacalyn Lefevre, MD 06/15/21 1243

## 2021-06-15 NOTE — ED Provider Triage Note (Signed)
Emergency Medicine Provider Triage Evaluation Note  Gabriela Jones , a 26 y.o. female  was evaluated in triage.  Pt complains of feeling dizzy- light headed. Similar to last year when blood sugar fluctuated, not diabetic. Symptoms worse with standing, also occur while sitting. No recent illness, falls.  Review of Systems  Positive: Light headed Negative: Weakness, numbness, changes in vision/speech/gait  Physical Exam  BP 140/75 (BP Location: Right Arm)   Pulse 97   Temp 98.5 F (36.9 C) (Oral)   Resp 16   LMP 05/17/2021   SpO2 100%  Gen:   Awake, no distress   Resp:  Normal effort  MSK:   Moves extremities without difficulty  Other:    Medical Decision Making  Medically screening exam initiated at 10:08 AM.  Appropriate orders placed.  Gabriela Jones was informed that the remainder of the evaluation will be completed by another provider, this initial triage assessment does not replace that evaluation, and the importance of remaining in the ED until their evaluation is complete.     Jeannie Fend, PA-C 06/15/21 1009

## 2021-06-15 NOTE — ED Triage Notes (Signed)
Pt states she started feeling dizzy yesterday. Denies any other symptoms. Pt denies any chance she could be pregnant.

## 2021-07-08 DIAGNOSIS — Z419 Encounter for procedure for purposes other than remedying health state, unspecified: Secondary | ICD-10-CM | POA: Diagnosis not present

## 2021-08-08 DIAGNOSIS — Z419 Encounter for procedure for purposes other than remedying health state, unspecified: Secondary | ICD-10-CM | POA: Diagnosis not present

## 2021-09-08 DIAGNOSIS — Z419 Encounter for procedure for purposes other than remedying health state, unspecified: Secondary | ICD-10-CM | POA: Diagnosis not present

## 2021-10-08 DIAGNOSIS — Z419 Encounter for procedure for purposes other than remedying health state, unspecified: Secondary | ICD-10-CM | POA: Diagnosis not present

## 2021-12-08 DIAGNOSIS — Z419 Encounter for procedure for purposes other than remedying health state, unspecified: Secondary | ICD-10-CM | POA: Diagnosis not present

## 2021-12-16 ENCOUNTER — Emergency Department (HOSPITAL_COMMUNITY)
Admission: EM | Admit: 2021-12-16 | Discharge: 2021-12-16 | Disposition: A | Discharge status: Home / Self Care | Payer: Medicaid Other | Attending: Emergency Medicine | Admitting: Emergency Medicine

## 2021-12-16 DIAGNOSIS — J3489 Other specified disorders of nose and nasal sinuses: Secondary | ICD-10-CM | POA: Diagnosis not present

## 2021-12-16 DIAGNOSIS — J029 Acute pharyngitis, unspecified: Secondary | ICD-10-CM | POA: Diagnosis not present

## 2021-12-16 MED ORDER — MUPIROCIN 2 % EX OINT: 1.0000 | TOPICAL_OINTMENT | Freq: Two times a day (BID) | CUTANEOUS | 0 refills | Status: AC

## 2021-12-16 NOTE — ED Provider Notes (Signed)
[place] EMERGENCY DEPARTMENT Provider Note   CSN: [id number] Arrival date & time: 12/16/21  1636     History  Chief Complaint  Patient presents with   Wound Check    Gabriela Jones is a 26 y.o. female.  26 year old female with complaint of lower spot inside her right nostril without injury.  Denies nosebleeds.  Does have piercing on this side, unsure if related.  No other blows or concerns.       Home Medications Prior to Admission medications   Medication Sig Start Date End Date Taking? Authorizing Provider  mupirocin ointment (BACTROBAN) 2 % Apply 1 Application topically 2 (two) times daily. 12/16/21  Yes Tacy Learn, PA-C  diclofenac (VOLTAREN) 50 MG EC tablet Take 1 tablet (50 mg total) by mouth 2 (two) times daily. 03/13/21   Raspet, Derry Skill, PA-C  meclizine (ANTIVERT) 25 MG tablet Take 1 tablet (25 mg total) by mouth 3 (three) times daily as needed for dizziness. 06/15/21   Isla Pence, MD  ondansetron (ZOFRAN-ODT) 4 MG disintegrating tablet Take 1 tablet (4 mg total) by mouth every 8 (eight) hours as needed for nausea or vomiting. 06/15/21   Isla Pence, MD      Allergies    Patient has no known allergies.    Review of Systems   Review of Systems Negative except as per HPI Physical Exam Updated Vital Signs BP 123/72 (BP Location: Right Arm)   Pulse 77   Temp 98.2 F (36.8 C)   Resp 14   SpO2 97%  Physical Exam Vitals and nursing note reviewed.  Constitutional:      General: She is not in acute distress.    Appearance: She is well-developed. She is not diaphoretic.  HENT:     Head: Normocephalic and atraumatic.     Nose:     Comments: Found to have small scab versus small ulcerated lesion to nasal turbinate inside right nostril, no active bleeding    Mouth/Throat:     Mouth: Mucous membranes are moist.  Pulmonary:     Effort: Pulmonary effort is normal.  Skin:    General: Skin is warm and dry.     Findings: No erythema or  rash.  Neurological:     Mental Status: She is alert and oriented to person, place, and time.  Psychiatric:        Behavior: Behavior normal.     ED Results / Procedures / Treatments   Labs (all labs ordered are listed, but only abnormal results are displayed) Labs Reviewed - No data to display  EKG None  Radiology No results found.  Procedures Procedures    Medications Ordered in ED Medications - No data to display  ED Course/ Medical Decision Making/ A&P                           Medical Decision Making  26 year old female with concern for pain inside her right nostril.  Found to have small scab versus small ulcer without active bleeding.  Patient is provided with Bactroban ointment.  Advised to apply warm compress as needed recheck with her doctor if symptoms persist.        Final Clinical Impression(s) / ED Diagnoses Final diagnoses:  Nasal sore    Rx / DC Orders ED Discharge Orders          Ordered    mupirocin ointment (BACTROBAN) 2 %  2 times daily  12/16/21 1658              Jeannie Fend, PA-C 12/16/21 1659    Gloris Manchester, MD 12/16/21 [phone]

## 2021-12-16 NOTE — ED Triage Notes (Signed)
Patient here requesting evaluation of redness around a piercing in her right nare. Patient is alert, oriented, and in no apparent distress at this time.

## 2021-12-16 NOTE — Discharge Instructions (Signed)
Apply ointment inside right nostril as prescribed. Recheck with your doctor if symptoms continue to bother you.  Can apply warm compress to the area for discomfort for 20 minutes at a time.

## 2022-01-08 DIAGNOSIS — Z419 Encounter for procedure for purposes other than remedying health state, unspecified: Secondary | ICD-10-CM | POA: Diagnosis not present

## 2022-01-12 DIAGNOSIS — Z13220 Encounter for screening for lipoid disorders: Secondary | ICD-10-CM | POA: Diagnosis not present

## 2022-01-12 DIAGNOSIS — Z0000 Encounter for general adult medical examination without abnormal findings: Secondary | ICD-10-CM | POA: Diagnosis not present

## 2022-01-12 DIAGNOSIS — Z1329 Encounter for screening for other suspected endocrine disorder: Secondary | ICD-10-CM | POA: Diagnosis not present

## 2022-01-12 DIAGNOSIS — Z131 Encounter for screening for diabetes mellitus: Secondary | ICD-10-CM | POA: Diagnosis not present

## 2022-01-12 DIAGNOSIS — Z1159 Encounter for screening for other viral diseases: Secondary | ICD-10-CM | POA: Diagnosis not present

## 2022-01-12 DIAGNOSIS — Z130 Encounter for screening for diseases of the blood and blood-forming organs and certain disorders involving the immune mechanism: Secondary | ICD-10-CM | POA: Diagnosis not present

## 2022-01-12 DIAGNOSIS — Z3046 Encounter for surveillance of implantable subdermal contraceptive: Secondary | ICD-10-CM | POA: Diagnosis not present

## 2022-01-12 DIAGNOSIS — Z113 Encounter for screening for infections with a predominantly sexual mode of transmission: Secondary | ICD-10-CM | POA: Diagnosis not present

## 2022-02-08 DIAGNOSIS — Z419 Encounter for procedure for purposes other than remedying health state, unspecified: Secondary | ICD-10-CM | POA: Diagnosis not present

## 2022-02-13 DIAGNOSIS — N898 Other specified noninflammatory disorders of vagina: Secondary | ICD-10-CM | POA: Diagnosis not present

## 2022-02-13 DIAGNOSIS — Z124 Encounter for screening for malignant neoplasm of cervix: Secondary | ICD-10-CM | POA: Diagnosis not present

## 2022-03-09 DIAGNOSIS — Z419 Encounter for procedure for purposes other than remedying health state, unspecified: Secondary | ICD-10-CM | POA: Diagnosis not present

## 2022-04-09 DIAGNOSIS — Z419 Encounter for procedure for purposes other than remedying health state, unspecified: Secondary | ICD-10-CM | POA: Diagnosis not present

## 2022-05-09 DIAGNOSIS — Z419 Encounter for procedure for purposes other than remedying health state, unspecified: Secondary | ICD-10-CM | POA: Diagnosis not present

## 2022-06-09 DIAGNOSIS — Z419 Encounter for procedure for purposes other than remedying health state, unspecified: Secondary | ICD-10-CM | POA: Diagnosis not present

## 2022-06-30 ENCOUNTER — Other Ambulatory Visit: Payer: Self-pay

## 2022-06-30 ENCOUNTER — Emergency Department (HOSPITAL_COMMUNITY)
Admission: EM | Admit: 2022-06-30 | Discharge: 2022-06-30 | Disposition: A | Discharge status: Home / Self Care | Payer: Medicaid Other | Attending: Emergency Medicine | Admitting: Emergency Medicine

## 2022-06-30 ENCOUNTER — Encounter (HOSPITAL_COMMUNITY): Payer: Self-pay | Admitting: *Deleted

## 2022-06-30 DIAGNOSIS — R519 Headache, unspecified: Secondary | ICD-10-CM

## 2022-06-30 MED ORDER — SUMATRIPTAN SUCCINATE 50 MG PO TABS: 50.0000 mg | ORAL_TABLET | ORAL | 0 refills | Status: AC | PRN

## 2022-06-30 MED ORDER — NAPROXEN 375 MG PO TABS: 375.0000 mg | ORAL_TABLET | Freq: Two times a day (BID) | ORAL | 0 refills | Status: DC

## 2022-06-30 NOTE — ED Provider Notes (Signed)
[place] EMERGENCY DEPARTMENT AT [facility] Provider Note   CSN: [id number] Arrival date & time: 06/30/22  1642     History  Chief Complaint  Patient presents with   Headache    Gabriela Jones is a 27 y.o. female.   Headache    Patient has a history of no significant medical problems but she states she has headaches ongoing for a couple of years.  She gets headaches frequently every couple of days.  Patient states sometimes the light bothers her.  Sometimes she also feels like that he triggers her headaches.  She is not having any trouble with any nausea or vomiting.  She does not have numbness or weakness.  She does not have any trouble with her vision.  She does not have issues with her balance or coordination.  She does not have any fevers or chills or neck pain.  The symptoms are not severe.  Patient has not seen anyone for this before but wanted to come to ED to make sure there is nothing severe going on  Home Medications Prior to Admission medications   Medication Sig Start Date End Date Taking? Authorizing Provider  naproxen (NAPROSYN) 375 MG tablet Take 1 tablet (375 mg total) by mouth 2 (two) times daily. 06/30/22  Yes Linwood Dibbles, MD  SUMAtriptan (IMITREX) 50 MG tablet Take 1 tablet (50 mg total) by mouth every 2 (two) hours as needed for migraine. May repeat in 2 hours if headache persists or recurs. 06/30/22  Yes Linwood Dibbles, MD  diclofenac (VOLTAREN) 50 MG EC tablet Take 1 tablet (50 mg total) by mouth 2 (two) times daily. 03/13/21   Raspet, Noberto Retort, PA-C  meclizine (ANTIVERT) 25 MG tablet Take 1 tablet (25 mg total) by mouth 3 (three) times daily as needed for dizziness. 06/15/21   Jacalyn Lefevre, MD  mupirocin ointment (BACTROBAN) 2 % Apply 1 Application topically 2 (two) times daily. 12/16/21   Jeannie Fend, PA-C  ondansetron (ZOFRAN-ODT) 4 MG disintegrating tablet Take 1 tablet (4 mg total) by mouth every 8 (eight) hours as needed for nausea or vomiting. 06/15/21    Jacalyn Lefevre, MD      Allergies    Patient has no known allergies.    Review of Systems   Review of Systems  Neurological:  Positive for headaches.    Physical Exam Updated Vital Signs BP 103/68   Pulse 92   Temp 98.8 F (37.1 C)   Resp 18   Ht 1.803 m (5\' 11" )   Wt 63.5 kg   LMP 06/01/2022   SpO2 99%   BMI 19.52 kg/m  Physical Exam Vitals and nursing note reviewed.  Constitutional:      General: She is not in acute distress.    Appearance: She is well-developed.  HENT:     Head: Normocephalic and atraumatic.     Right Ear: External ear normal.     Left Ear: External ear normal.  Eyes:     General: No visual field deficit or scleral icterus.       Right eye: No discharge.        Left eye: No discharge.     Conjunctiva/sclera: Conjunctivae normal.  Neck:     Trachea: No tracheal deviation.  Cardiovascular:     Rate and Rhythm: Normal rate and regular rhythm.  Pulmonary:     Effort: Pulmonary effort is normal. No respiratory distress.     Breath sounds: Normal breath sounds. No  stridor. No wheezing or rales.  Abdominal:     General: Bowel sounds are normal. There is no distension.     Palpations: Abdomen is soft.     Tenderness: There is no abdominal tenderness. There is no guarding or rebound.  Musculoskeletal:        General: No tenderness.     Cervical back: Normal range of motion and neck supple.  Skin:    General: Skin is warm and dry.     Findings: No rash.  Neurological:     Mental Status: She is alert and oriented to person, place, and time.     Cranial Nerves: No cranial nerve deficit, dysarthria or facial asymmetry.     Sensory: No sensory deficit.     Motor: No abnormal muscle tone, seizure activity or pronator drift.     Coordination: Coordination normal.     Comments:  able to hold both legs off bed for 5 seconds, sensation intact in all extremities,  no left or right sided neglect, normal finger-nose exam bilaterally, no nystagmus noted    Psychiatric:        Mood and Affect: Mood normal.     ED Results / Procedures / Treatments   Labs (all labs ordered are listed, but only abnormal results are displayed) Labs Reviewed - No data to display  EKG None  Radiology No results found.  Procedures Procedures    Medications Ordered in ED Medications - No data to display  ED Course/ Medical Decision Making/ A&P                             Medical Decision Making Risk Prescription drug management.   Patient's exam is reassuring.  She does not have any findings to suggest infection.  Her neck is supple.  Patient does not have any sudden onset to suggest vascular headache, subarachnoid hemorrhage.  Patient's symptoms have been ongoing for couple years but she is not having any trouble with weakness or numbness vision changes to suggest brain tumor or other emergent etiology.  It is possible patient may be having migraine type headaches.  Will try her on a course of Naprosyn and Imitrex.  Recommended follow-up with her primary care doctor to make sure the symptoms are improving.  They may also want to discuss daily preventative medication considering the frequency of your headaches.  She may also benefit from neurology consultation if her headaches persist.        Final Clinical Impression(s) / ED Diagnoses Final diagnoses:  Headache disorder    Rx / DC Orders ED Discharge Orders          Ordered    SUMAtriptan (IMITREX) 50 MG tablet  Every 2 hours PRN        06/30/22 1824    naproxen (NAPROSYN) 375 MG tablet  2 times daily        06/30/22 Vashti Hey, MD 06/30/22 1826

## 2022-06-30 NOTE — ED Triage Notes (Signed)
The pt is c/o a headache for 2 weeks  nothing she has tried has helped her headache  lmp  last month

## 2022-06-30 NOTE — Discharge Instructions (Addendum)
The Imitrex is a medication that can help with migraine headaches.  Naproxen is an anti-inflammatory pain medication.  Take these medications to see if that helps with your headache.  Follow-up with your primary care doctor to discuss further treatment.  Return to the ER for severe headache high fever or other concerning symptoms

## 2022-08-09 ENCOUNTER — Ambulatory Visit (INDEPENDENT_AMBULATORY_CARE_PROVIDER_SITE_OTHER): Payer: Medicaid Other | Admitting: Plastic Surgery

## 2022-08-09 ENCOUNTER — Encounter: Payer: Self-pay | Admitting: Plastic Surgery

## 2022-08-09 VITALS — BP 125/78 | HR 97 | Resp 16 | Ht 71.0 in | Wt 226.0 lb

## 2022-08-09 DIAGNOSIS — E65 Localized adiposity: Secondary | ICD-10-CM | POA: Diagnosis not present

## 2022-08-09 NOTE — Progress Notes (Signed)
Referring Provider No referring provider defined for this encounter.   CC:  Chief Complaint  Patient presents with   Consult      Gabriela Jones is an 27 y.o. female.  HPI: Gabriela Jones is a 27 year old female who presents today with complaints that she is unhappy with the appearance of her anterior abdominal wall.  Specifically she does not like the way it "jungles" she is having 1 child.  No Known Allergies  Outpatient Encounter Medications as of 08/09/2022  Medication Sig   diclofenac (VOLTAREN) 50 MG EC tablet Take 1 tablet (50 mg total) by mouth 2 (two) times daily.   meclizine (ANTIVERT) 25 MG tablet Take 1 tablet (25 mg total) by mouth 3 (three) times daily as needed for dizziness.   mupirocin ointment (BACTROBAN) 2 % Apply 1 Application topically 2 (two) times daily.   naproxen (NAPROSYN) 375 MG tablet Take 1 tablet (375 mg total) by mouth 2 (two) times daily.   ondansetron (ZOFRAN-ODT) 4 MG disintegrating tablet Take 1 tablet (4 mg total) by mouth every 8 (eight) hours as needed for nausea or vomiting.   SUMAtriptan (IMITREX) 50 MG tablet Take 1 tablet (50 mg total) by mouth every 2 (two) hours as needed for migraine. May repeat in 2 hours if headache persists or recurs.   No facility-administered encounter medications on file as of 08/09/2022.     Past Medical History:  Diagnosis Date   Medical history non-contributory     Past Surgical History:  Procedure Laterality Date   TONSILLECTOMY      Family History  Problem Relation Age of Onset   Hypertension Father     Social History   Social History Narrative   Not on file     Review of Systems General: Denies fevers, chills, weight loss CV: Denies chest pain, shortness of breath, palpitations Abdomen: Unhappy with the overall appearance of the abdomen.  No specific complaints  Physical Exam    08/09/2022   10:41 AM 06/30/2022    4:55 PM 06/30/2022    4:53 PM  Vitals with BMI  Height 5\' 11"   5\' 11"   Weight  226 lbs  140 lbs  BMI 31.53  19.53  Systolic 125 103   Diastolic 78 68   Pulse 97 92     General:  No acute distress,  Alert and oriented, Non-Toxic, Normal speech and affect Abdomen: Patient has a slightly protuberant abdomen with no pannus.  She has a small possibly up to 1 cm rectus diastases.  There is no easily palpable hernias.  She has no pain to palpation Mammogram: Not applicable due to age Assessment/Plan Localized adiposity, abdomen: Had a long discussion with the patient regarding improving the appearance of her anterior abdominal wall.  She would be a candidate for an aesthetic abdominoplasty however I think that the improvement would not justify the cost.  She is not a candidate for any type of insurance covered panniculectomy at this time.  We discussed improving the appearance of her abdomen with diet and exercise.  I discussed with her the importance of aerobic activity 5 to 7 days a week for minimum of 30 minutes and the addition of weight training 3 times per week.  We also discussed the importance of avoiding highly processed foods especially highly processed carbohydrates.  If she loses weight and has excess anterior abdominal wall skin we can reassess the possibility of a panniculectomy or abdominoplasty at that time.  She is welcome to  follow-up at any time.  Gabriela Jones 08/09/2022, 12:04 PM

## 2022-08-23 ENCOUNTER — Encounter (HOSPITAL_COMMUNITY): Payer: Self-pay | Admitting: Emergency Medicine

## 2022-08-23 ENCOUNTER — Ambulatory Visit (HOSPITAL_COMMUNITY)
Admission: EM | Admit: 2022-08-23 | Discharge: 2022-08-23 | Disposition: A | Discharge status: Home / Self Care | Payer: Medicaid Other | Attending: Emergency Medicine | Admitting: Emergency Medicine

## 2022-08-23 ENCOUNTER — Ambulatory Visit (INDEPENDENT_AMBULATORY_CARE_PROVIDER_SITE_OTHER): Payer: Medicaid Other

## 2022-08-23 DIAGNOSIS — M79601 Pain in right arm: Secondary | ICD-10-CM | POA: Diagnosis not present

## 2022-08-23 DIAGNOSIS — M7989 Other specified soft tissue disorders: Secondary | ICD-10-CM | POA: Diagnosis not present

## 2022-08-23 DIAGNOSIS — R2231 Localized swelling, mass and lump, right upper limb: Secondary | ICD-10-CM

## 2022-08-23 NOTE — ED Provider Notes (Signed)
[facility]    CSN: [id number] Arrival date & time: 08/23/22  9528      History   Chief Complaint Chief Complaint  Patient presents with   Arm Pain    HPI Gabriela Jones is a 27 y.o. female.   Patient was reaching to get out of the bed yesterday extended her right forearm and began to have pain and swelling.  Patient denies any previous injuries slight pain to the elbow but once into the forearm area.  Has not take anything prior to arrival.     Past Medical History:  Diagnosis Date   Medical history non-contributory     Patient Active Problem List   Diagnosis Date Noted   Indication for care in labor or delivery 04/14/2018   Acne vulgaris 03/30/2016    Past Surgical History:  Procedure Laterality Date   TONSILLECTOMY      OB History     Gravida  1   Para  1   Term  1   Preterm      AB      Living  1      SAB      IAB      Ectopic      Multiple  0   Live Births  1            Home Medications    Prior to Admission medications   Medication Sig Start Date End Date Taking? Authorizing Provider  diclofenac (VOLTAREN) 50 MG EC tablet Take 1 tablet (50 mg total) by mouth 2 (two) times daily. 03/13/21   Raspet, Noberto Retort, PA-C  meclizine (ANTIVERT) 25 MG tablet Take 1 tablet (25 mg total) by mouth 3 (three) times daily as needed for dizziness. 06/15/21   Jacalyn Lefevre, MD  mupirocin ointment (BACTROBAN) 2 % Apply 1 Application topically 2 (two) times daily. 12/16/21   Jeannie Fend, PA-C  naproxen (NAPROSYN) 375 MG tablet Take 1 tablet (375 mg total) by mouth 2 (two) times daily. 06/30/22   Linwood Dibbles, MD  ondansetron (ZOFRAN-ODT) 4 MG disintegrating tablet Take 1 tablet (4 mg total) by mouth every 8 (eight) hours as needed for nausea or vomiting. 06/15/21   Jacalyn Lefevre, MD  SUMAtriptan (IMITREX) 50 MG tablet Take 1 tablet (50 mg total) by mouth every 2 (two) hours as needed for migraine. May repeat in 2 hours if headache persists or  recurs. 06/30/22   Linwood Dibbles, MD    Family History Family History  Problem Relation Age of Onset   Hypertension Father     Social History Social History   Tobacco Use   Smoking status: Passive Smoke Exposure - Never Smoker   Smokeless tobacco: Never  Vaping Use   Vaping status: Never Used  Substance Use Topics   Alcohol use: No   Drug use: No     Allergies   Patient has no known allergies.   Review of Systems Review of Systems  Respiratory: Negative.    Cardiovascular: Negative.   Musculoskeletal:        Right upper for pain tenderness and slight swelling  Skin:        Slight swelling to upper arm and tenderness to touch  Neurological: Negative.      Physical Exam Triage Vital Signs ED Triage Vitals  Encounter Vitals Group     BP 08/23/22 1006 109/68     Systolic BP Percentile --      Diastolic BP Percentile --  Pulse Rate 08/23/22 1006 85     Resp 08/23/22 1006 15     Temp 08/23/22 1006 98 F (36.7 C)     Temp Source 08/23/22 1006 Oral     SpO2 08/23/22 1006 98 %     Weight --      Height --      Head Circumference --      Peak Flow --      Pain Score 08/23/22 1005 6     Pain Loc --      Pain Education --      Exclude from Growth Chart --    No data found.  Updated Vital Signs BP 109/68 (BP Location: Left Arm)   Pulse 85   Temp 98 F (36.7 C) (Oral)   Resp 15   LMP 08/13/2022   SpO2 98%   Visual Acuity Right Eye Distance:   Left Eye Distance:   Bilateral Distance:    Right Eye Near:   Left Eye Near:    Bilateral Near:     Physical Exam Constitutional:      Appearance: Normal appearance.  Cardiovascular:     Rate and Rhythm: Normal rate.  Pulmonary:     Effort: Pulmonary effort is normal.  Musculoskeletal:        General: Swelling, tenderness and signs of injury present. No deformity.     Comments: Decreased range of motion to the right upper due to discomfort.  Warm to touch no erythema noted +1 edema.  Full range of  motion to right sided digits.  Strong pulses no signs of dislocation  Skin:    Capillary Refill: Capillary refill takes less than 2 seconds.  Neurological:     General: No focal deficit present.     Mental Status: She is alert.      UC Treatments / Results  Labs (all labs ordered are listed, but only abnormal results are displayed) Labs Reviewed - No data to display  EKG   Radiology No results found.  Procedures Procedures (including critical care time)  Medications Ordered in UC Medications - No data to display  Initial Impression / Assessment and Plan / UC Course  I have reviewed the triage vital signs and the nursing notes.  Pertinent labs & imaging results that were available during my care of the patient were reviewed by me and considered in my medical decision making (see chart for details).     Your x-rays are negative for any fracture You have more safe sprain your arm Use warm compresses Take Tylenol Motrin as needed for pain Wear sling for 3 days then take arm out and begin to slowly move around Symptoms become worse you can call to follow-up with orthopedic for further testing  Final Clinical Impressions(s) / UC Diagnoses   Final diagnoses:  None   Discharge Instructions   None    ED Prescriptions   None    PDMP not reviewed this encounter.   Coralyn Mark, NP 08/23/22 1137

## 2022-08-23 NOTE — ED Triage Notes (Signed)
Pt reports when getting self on the bed yesterday her right arm bent up backwards. Patient c/o pain from wrist to elbow.

## 2022-08-23 NOTE — Discharge Instructions (Addendum)
Your x-rays are negative for any fracture You have more safe sprain your arm Use warm compresses Take Tylenol Motrin as needed for pain Wear sling for 3 days then take arm out and begin to slowly move around Symptoms become worse you can call to follow-up with orthopedic for further testing

## 2022-08-27 DIAGNOSIS — M79601 Pain in right arm: Secondary | ICD-10-CM | POA: Diagnosis not present

## 2022-09-07 DIAGNOSIS — L700 Acne vulgaris: Secondary | ICD-10-CM | POA: Diagnosis not present

## 2022-09-09 DIAGNOSIS — Z419 Encounter for procedure for purposes other than remedying health state, unspecified: Secondary | ICD-10-CM | POA: Diagnosis not present

## 2022-10-09 DIAGNOSIS — Z419 Encounter for procedure for purposes other than remedying health state, unspecified: Secondary | ICD-10-CM | POA: Diagnosis not present

## 2022-11-09 DIAGNOSIS — Z419 Encounter for procedure for purposes other than remedying health state, unspecified: Secondary | ICD-10-CM | POA: Diagnosis not present

## 2023-01-09 DIAGNOSIS — Z419 Encounter for procedure for purposes other than remedying health state, unspecified: Secondary | ICD-10-CM | POA: Diagnosis not present

## 2023-01-30 ENCOUNTER — Encounter (HOSPITAL_COMMUNITY): Payer: Self-pay | Admitting: Emergency Medicine

## 2023-01-30 ENCOUNTER — Emergency Department (HOSPITAL_BASED_OUTPATIENT_CLINIC_OR_DEPARTMENT_OTHER): Payer: Medicaid Other

## 2023-01-30 ENCOUNTER — Other Ambulatory Visit: Payer: Self-pay

## 2023-01-30 ENCOUNTER — Emergency Department (HOSPITAL_COMMUNITY)
Admission: EM | Admit: 2023-01-30 | Discharge: 2023-01-30 | Disposition: A | Discharge status: Home / Self Care | Payer: Medicaid Other | Attending: Emergency Medicine | Admitting: Emergency Medicine

## 2023-01-30 DIAGNOSIS — M79662 Pain in left lower leg: Secondary | ICD-10-CM

## 2023-01-30 DIAGNOSIS — M79605 Pain in left leg: Secondary | ICD-10-CM | POA: Diagnosis not present

## 2023-01-30 NOTE — ED Notes (Signed)
Dc instructions reviewed with pt no questions or concerns at this time. Will take OTC ibuprofen to help with pain. Ambulated out of ED.

## 2023-01-30 NOTE — Discharge Instructions (Signed)
The test done to evaluate for blood clot in the left leg is negative. This discomfort is likely musculoskeletal pain from the walk 2 days ago. Take ibuprofen 600 mg (3 over-the-counter strength tablets) every 6 hours. Warm compresses may help soreness as well.

## 2023-01-30 NOTE — ED Triage Notes (Signed)
PT complaints of left lower leg swelling after walking in parade 2 days ago. No swelling noted to leg. Pt states some discomfort in leg and foot when sitting for a long time. Denies any pain at this time. Ambulated to triage.

## 2023-01-30 NOTE — ED Provider Notes (Signed)
[facility] Note   CSN: [id number] Arrival date & time: 01/30/23  1140     History  Chief Complaint  Patient presents with   Leg Swelling    Gabriela Jones is a 28 y.o. female.  Complains of pain and swelling of the left lower leg since walking in a parade 2 days ago. No SOB, CP. On contraceptives.        Home Medications Prior to Admission medications   Medication Sig Start Date End Date Taking? Authorizing Provider  diclofenac (VOLTAREN) 50 MG EC tablet Take 1 tablet (50 mg total) by mouth 2 (two) times daily. 03/13/21   Raspet, Noberto Retort, PA-C  meclizine (ANTIVERT) 25 MG tablet Take 1 tablet (25 mg total) by mouth 3 (three) times daily as needed for dizziness. 06/15/21   Jacalyn Lefevre, MD  mupirocin ointment (BACTROBAN) 2 % Apply 1 Application topically 2 (two) times daily. 12/16/21   Jeannie Fend, PA-C  naproxen (NAPROSYN) 375 MG tablet Take 1 tablet (375 mg total) by mouth 2 (two) times daily. 06/30/22   Linwood Dibbles, MD  ondansetron (ZOFRAN-ODT) 4 MG disintegrating tablet Take 1 tablet (4 mg total) by mouth every 8 (eight) hours as needed for nausea or vomiting. 06/15/21   Jacalyn Lefevre, MD  SUMAtriptan (IMITREX) 50 MG tablet Take 1 tablet (50 mg total) by mouth every 2 (two) hours as needed for migraine. May repeat in 2 hours if headache persists or recurs. 06/30/22   Linwood Dibbles, MD      Allergies    Patient has no known allergies.    Review of Systems   Review of Systems  Physical Exam Updated Vital Signs BP 121/69 (BP Location: Right Arm)   Pulse 96   Temp 98.2 F (36.8 C) (Oral)   Resp 18   Ht 5\' 11"  (1.803 m)   Wt 102.5 kg   LMP 01/22/2023 (Approximate)   SpO2 100%   BMI 31.52 kg/m  Physical Exam Constitutional:      General: She is not in acute distress.    Appearance: She is well-developed. She is not ill-appearing.  Pulmonary:     Effort: Pulmonary effort is normal.  Musculoskeletal:         General: Normal range of motion.     Cervical back: Normal range of motion.     Comments: Tender to posterior left calf. No swelling noted. No redness. Full strength. Intact distal pulses.   Skin:    General: Skin is warm and dry.  Neurological:     Mental Status: She is alert and oriented to person, place, and time.     Sensory: No sensory deficit.     ED Results / Procedures / Treatments   Labs (all labs ordered are listed, but only abnormal results are displayed) Labs Reviewed - No data to display  EKG None  Radiology VAS [place] LOWER EXTREMITY VENOUS (DVT) (ONLY [facility]) Result Date: 01/30/2023  Lower Venous DVT Study Patient Name:  Gabriela Jones  Date of Exam:   01/30/2023 Medical Rec #: [id number]       Accession #:    [id number] Date of Birth: 28-May-1995       Patient Gender: F Patient Age:   30 years Exam Location:  [facility] Procedure:      VAS [place] LOWER EXTREMITY VENOUS (DVT) Referring Phys: Melvenia Beam Violette Morneault --------------------------------------------------------------------------------  Indications: Pain, and Swelling x 2-3 days  Risk Factors:  Birth control. Comparison Study: No previous exams Performing Technologist: Jody Hill RVT, RDMS  Examination Guidelines: A complete evaluation includes B-mode imaging, spectral Doppler, color Doppler, and power Doppler as needed of all accessible portions of each vessel. Bilateral testing is considered an integral part of a complete examination. Limited examinations for reoccurring indications may be performed as noted. The reflux portion of the exam is performed with the patient in reverse Trendelenburg.  +-----+---------------+---------+-----------+----------+--------------+ RIGHTCompressibilityPhasicitySpontaneityPropertiesThrombus Aging +-----+---------------+---------+-----------+----------+--------------+ CFV  Full           Yes      Yes                                  +-----+---------------+---------+-----------+----------+--------------+   +---------+---------------+---------+-----------+----------+--------------+ LEFT     CompressibilityPhasicitySpontaneityPropertiesThrombus Aging +---------+---------------+---------+-----------+----------+--------------+ CFV      Full           Yes      Yes                                 +---------+---------------+---------+-----------+----------+--------------+ SFJ      Full                                                        +---------+---------------+---------+-----------+----------+--------------+ FV Prox  Full           Yes      Yes                                 +---------+---------------+---------+-----------+----------+--------------+ FV Mid   Full           Yes      Yes                                 +---------+---------------+---------+-----------+----------+--------------+ FV DistalFull           Yes      Yes                                 +---------+---------------+---------+-----------+----------+--------------+ PFV      Full                                                        +---------+---------------+---------+-----------+----------+--------------+ POP      Full           Yes      Yes                                 +---------+---------------+---------+-----------+----------+--------------+ PTV      Full                                                        +---------+---------------+---------+-----------+----------+--------------+  PERO     Full                                                        +---------+---------------+---------+-----------+----------+--------------+     Summary: RIGHT: - No evidence of common femoral vein obstruction.   LEFT: - There is no evidence of deep vein thrombosis in the lower extremity.  - No cystic structure found in the popliteal fossa.  *See table(s) above for measurements and observations.    Preliminary      Procedures Procedures    Medications Ordered in ED Medications - No data to display  ED Course/ Medical Decision Making/ A&P Clinical Course as of 01/30/23 1426  Wed Jan 30, 2023  1423 Venous duplex performed of the left LE and is negative for dVT.  Feel the pain is likely MSK pain requiring supportive care.  [SU]    Clinical Course User Index [SU] Elpidio Anis, PA-C                                 Medical Decision Making          Final Clinical Impression(s) / ED Diagnoses Final diagnoses:  Left leg pain    Rx / DC Orders ED Discharge Orders     None         Danne Harbor 01/30/23 1426    Lonell Grandchild, MD 01/30/23 1630

## 2023-01-30 NOTE — Progress Notes (Signed)
LLE venous duplex has been completed.  Preliminary results given to Elpidio Anis, PA-C.    Results can be found under chart review under CV PROC. 01/30/2023 2:18 PM Swetha Rayle RVT, RDMS

## 2023-01-30 NOTE — ED Provider Triage Note (Signed)
Emergency Medicine Provider Triage Evaluation Note  Gabriela Jones , a 28 y.o. female  was evaluated in triage.  Pt complains of left leg pain/swelling x 2-3 days. No history of clots. On contraceptives.  Review of Systems  Positive: Leg pain and swelling Negative: SOB, CP  Physical Exam  BP 121/69 (BP Location: Right Arm)   Pulse 96   Temp 98.2 F (36.8 C) (Oral)   Resp 18   Ht 5\' 11"  (1.803 m)   Wt 102.5 kg   LMP 01/22/2023 (Approximate)   SpO2 100%   BMI 31.52 kg/m  Gen:   Awake, no distress   Resp:  Normal effort  MSK:   Moves extremities without difficulty Left calf tenderness without redness. +Homen's Other:    Medical Decision Making  Medically screening exam initiated at 12:57 PM.  Appropriate orders placed.  Mane A Macisaac was informed that the remainder of the evaluation will be completed by another provider, this initial triage assessment does not replace that evaluation, and the importance of remaining in the ED until their evaluation is complete.  R/o DVT Doppler pending   Elpidio Anis, PA-C 01/30/23 1259

## 2023-02-04 ENCOUNTER — Other Ambulatory Visit: Payer: Self-pay

## 2023-02-04 ENCOUNTER — Encounter (HOSPITAL_COMMUNITY): Payer: Self-pay

## 2023-02-04 ENCOUNTER — Emergency Department (HOSPITAL_COMMUNITY)
Admission: EM | Admit: 2023-02-04 | Discharge: 2023-02-04 | Discharge status: Against Medical Advice | Payer: Medicaid Other | Attending: Emergency Medicine | Admitting: Emergency Medicine

## 2023-02-04 DIAGNOSIS — Z5321 Procedure and treatment not carried out due to patient leaving prior to being seen by health care provider: Secondary | ICD-10-CM | POA: Diagnosis not present

## 2023-02-04 DIAGNOSIS — Z413 Encounter for ear piercing: Secondary | ICD-10-CM | POA: Diagnosis not present

## 2023-02-04 NOTE — ED Notes (Signed)
Pt stated she had been waiting too long and was going to leave. Staff advised her to wait a little longer but pt declined and left.

## 2023-02-04 NOTE — ED Triage Notes (Signed)
Pt arrives with c/o bump on her right dermal piercing. Pt denies redness, drainage, fevers.

## 2023-02-04 NOTE — ED Provider Triage Note (Signed)
Emergency Medicine Provider Triage Evaluation Note  Gabriela Jones , a 28 y.o. female  was evaluated in triage.  Pt complains of piercing problem.  Review of Systems  Positive: Pain, raised Negative: Discharge, fever, redness, fever, chills  Physical Exam  Wt 102.1 kg   LMP 01/22/2023 (Approximate)   BMI 31.38 kg/m  Gen:   Awake, no distress   Resp:  Normal effort  MSK:   Moves extremities without difficulty  Other:    Medical Decision Making  Medically screening exam initiated at 5:14 PM.  Appropriate orders placed.  Taleyah A Tuch was informed that the remainder of the evaluation will be completed by another provider, this initial triage assessment does not replace that evaluation, and the importance of remaining in the ED until their evaluation is complete.     Dolphus Jenny, PA-C 02/04/23 1715

## 2023-02-09 DIAGNOSIS — Z419 Encounter for procedure for purposes other than remedying health state, unspecified: Secondary | ICD-10-CM | POA: Diagnosis not present

## 2023-02-11 ENCOUNTER — Other Ambulatory Visit: Payer: Self-pay

## 2023-02-11 ENCOUNTER — Emergency Department (HOSPITAL_COMMUNITY)
Admission: EM | Admit: 2023-02-11 | Discharge: 2023-02-11 | Disposition: A | Discharge status: Home / Self Care | Payer: Medicaid Other | Attending: Emergency Medicine | Admitting: Emergency Medicine

## 2023-02-11 DIAGNOSIS — Z418 Encounter for other procedures for purposes other than remedying health state: Secondary | ICD-10-CM | POA: Insufficient documentation

## 2023-02-11 DIAGNOSIS — S31030A Puncture wound without foreign body of lower back and pelvis without penetration into retroperitoneum, initial encounter: Secondary | ICD-10-CM | POA: Diagnosis not present

## 2023-02-11 DIAGNOSIS — Z789 Other specified health status: Secondary | ICD-10-CM

## 2023-02-11 NOTE — ED Triage Notes (Signed)
Patient has dermal piercing to R lower back >1 year and is here for eval of redness, pain, and malposition to same x 2 weeks. Denies drainage, fevers, or chills.

## 2023-02-11 NOTE — ED Provider Notes (Signed)
[place] EMERGENCY DEPARTMENT AT [facility] Provider Note   CSN: [id number] Arrival date & time: 02/11/23  7829     History  No chief complaint on file.   Gabriela Jones is a 28 y.o. female with no pertinent past medical history presented for piercing check.  Patient has dermal piercing in her lumbar region and states that she feels this malposition.  Patient denies fevers nausea vomiting redness or warmth in the area.  Patient is having bowel movements okay and denies any saddle anesthesia.    Home Medications Prior to Admission medications   Medication Sig Start Date End Date Taking? Authorizing Provider  diclofenac (VOLTAREN) 50 MG EC tablet Take 1 tablet (50 mg total) by mouth 2 (two) times daily. 03/13/21   Raspet, Noberto Retort, PA-C  meclizine (ANTIVERT) 25 MG tablet Take 1 tablet (25 mg total) by mouth 3 (three) times daily as needed for dizziness. 06/15/21   Jacalyn Lefevre, MD  mupirocin ointment (BACTROBAN) 2 % Apply 1 Application topically 2 (two) times daily. 12/16/21   Jeannie Fend, PA-C  naproxen (NAPROSYN) 375 MG tablet Take 1 tablet (375 mg total) by mouth 2 (two) times daily. 06/30/22   Linwood Dibbles, MD  ondansetron (ZOFRAN-ODT) 4 MG disintegrating tablet Take 1 tablet (4 mg total) by mouth every 8 (eight) hours as needed for nausea or vomiting. 06/15/21   Jacalyn Lefevre, MD  SUMAtriptan (IMITREX) 50 MG tablet Take 1 tablet (50 mg total) by mouth every 2 (two) hours as needed for migraine. May repeat in 2 hours if headache persists or recurs. 06/30/22   Linwood Dibbles, MD      Allergies    Patient has no known allergies.    Review of Systems   Review of Systems  Physical Exam Updated Vital Signs BP 126/72 (BP Location: Right Arm)   Pulse 88   Temp (!) 97.3 F (36.3 C) (Oral)   Resp 14   LMP 01/22/2023 (Approximate)   SpO2 100%  Physical Exam Constitutional:      General: She is not in acute distress. Musculoskeletal:        General: Normal range of motion.   Skin:    General: Skin is warm and dry.     Comments: Dermal piercing noted on the right paralumbar region that slightly slanted with mild hyper keratotic area around however no erythema, fluctuance, warmth, purulent material or bleeding noted  Neurological:     Mental Status: She is alert.  Psychiatric:        Mood and Affect: Mood normal.     ED Results / Procedures / Treatments   Labs (all labs ordered are listed, but only abnormal results are displayed) Labs Reviewed - No data to display  EKG None  Radiology No results found.  Procedures Procedures    Medications Ordered in ED Medications - No data to display  ED Course/ Medical Decision Making/ A&P                                 Medical Decision Making  Karina A Maniaci 28 y.o. presented today for piercing check. Working DDx that I considered at this time includes, but not limited to, piercing tract, cellulitis, abscess.  R/o DDx: Cellulitis, abscess: These are considered less likely due to history of present illness, physical exam, labs/imaging findings  Review of prior external notes: 01/30/2023 ED  Unique Tests and My Independent  Interpretation: None  Social Determinants of Health: none  Discussion with Independent Historian:  Mother  Discussion of Management of Tests: None  Risk: Low: based on diagnostic testing/clinical impression and treatment plan  Risk Stratification Score: None  Plan: On exam patient was in no acute distress stable vitals.  Patient on exam does have dermal piercing in her low back that does appear malpositioned however besides a small amount of hyperkeratinization near the area there was no erythema, warmth, fluctuance, purulent material or any tenderness in the area that would necessitate signs of infection.  I recommended patient goes back to the individual who put in the piercings to have them removed and to keep the area clean and dry in the meantime use Tylenol every 6 hours  needed for pain.  I discussed with the patient reasons to return to the ER including fevers, pus, erythema or abdominal pain and patient verbalized understanding acceptance of this.  Patient was given return precautions. Patient stable for discharge at this time.  Patient verbalized understanding of plan.  This chart was dictated using voice recognition software.  Despite best efforts to proofread,  errors can occur which can change the documentation meaning.         Final Clinical Impression(s) / ED Diagnoses Final diagnoses:  Body piercing    Rx / DC Orders ED Discharge Orders     None         Remi Deter 02/11/23 4098    Gerhard Munch, MD 02/11/23 [phone]

## 2023-02-11 NOTE — Discharge Instructions (Signed)
Please follow-up with the individual that put in the piercings to have it removed and along with your primary care provider.  Today your exam does not show any signs of infection however if you begin to have pus, large area of redness and warmth or fluctuance along with fevers please return to the ER to be evaluated.  You may take Tylenol every 6 hours as needed for pain.  Please keep the area clean in the meantime.

## 2023-02-15 DIAGNOSIS — M795 Residual foreign body in soft tissue: Secondary | ICD-10-CM | POA: Diagnosis not present

## 2023-03-07 DIAGNOSIS — Z789 Other specified health status: Secondary | ICD-10-CM | POA: Diagnosis not present

## 2023-03-09 DIAGNOSIS — Z419 Encounter for procedure for purposes other than remedying health state, unspecified: Secondary | ICD-10-CM | POA: Diagnosis not present

## 2023-03-20 DIAGNOSIS — Z419 Encounter for procedure for purposes other than remedying health state, unspecified: Secondary | ICD-10-CM | POA: Diagnosis not present

## 2023-04-11 DIAGNOSIS — Z789 Other specified health status: Secondary | ICD-10-CM | POA: Diagnosis not present

## 2023-04-18 ENCOUNTER — Emergency Department (HOSPITAL_COMMUNITY)

## 2023-04-18 ENCOUNTER — Other Ambulatory Visit: Payer: Self-pay

## 2023-04-18 ENCOUNTER — Emergency Department (HOSPITAL_COMMUNITY)
Admission: EM | Admit: 2023-04-18 | Discharge: 2023-04-19 | Disposition: A | Discharge status: Home / Self Care | Attending: Emergency Medicine | Admitting: Emergency Medicine

## 2023-04-18 ENCOUNTER — Encounter (HOSPITAL_COMMUNITY): Payer: Self-pay

## 2023-04-18 DIAGNOSIS — R1031 Right lower quadrant pain: Secondary | ICD-10-CM | POA: Diagnosis not present

## 2023-04-18 DIAGNOSIS — N83202 Unspecified ovarian cyst, left side: Secondary | ICD-10-CM | POA: Diagnosis not present

## 2023-04-18 DIAGNOSIS — R102 Pelvic and perineal pain: Secondary | ICD-10-CM | POA: Diagnosis not present

## 2023-04-18 DIAGNOSIS — R1032 Left lower quadrant pain: Secondary | ICD-10-CM | POA: Diagnosis not present

## 2023-04-18 DIAGNOSIS — N854 Malposition of uterus: Secondary | ICD-10-CM | POA: Diagnosis not present

## 2023-04-18 DIAGNOSIS — R6883 Chills (without fever): Secondary | ICD-10-CM | POA: Diagnosis not present

## 2023-04-18 DIAGNOSIS — R1012 Left upper quadrant pain: Secondary | ICD-10-CM | POA: Diagnosis present

## 2023-04-18 LAB — URINALYSIS, ROUTINE W REFLEX MICROSCOPIC
Bilirubin Urine: NEGATIVE
Glucose, UA: NEGATIVE mg/dL
Hgb urine dipstick: NEGATIVE
Ketones, ur: 5 mg/dL — AB
Nitrite: NEGATIVE
Protein, ur: NEGATIVE mg/dL
Specific Gravity, Urine: 1.016 (ref 1.005–1.030)
pH: 6 (ref 5.0–8.0)

## 2023-04-18 LAB — CBC
HCT: 39.4 % (ref 36.0–46.0)
Hemoglobin: 12.3 g/dL (ref 12.0–15.0)
MCH: 27.5 pg (ref 26.0–34.0)
MCHC: 31.2 g/dL (ref 30.0–36.0)
MCV: 88.1 fL (ref 80.0–100.0)
Platelets: 182 10*3/uL (ref 150–400)
RBC: 4.47 MIL/uL (ref 3.87–5.11)
RDW: 13.9 % (ref 11.5–15.5)
WBC: 7.3 10*3/uL (ref 4.0–10.5)
nRBC: 0 % (ref 0.0–0.2)

## 2023-04-18 LAB — COMPREHENSIVE METABOLIC PANEL WITH GFR
ALT: 9 U/L (ref 0–44)
AST: 12 U/L — ABNORMAL LOW (ref 15–41)
Albumin: 3.9 g/dL (ref 3.5–5.0)
Alkaline Phosphatase: 37 U/L — ABNORMAL LOW (ref 38–126)
Anion gap: 9 (ref 5–15)
BUN: 5 mg/dL — ABNORMAL LOW (ref 6–20)
CO2: 24 mmol/L (ref 22–32)
Calcium: 9.1 mg/dL (ref 8.9–10.3)
Chloride: 104 mmol/L (ref 98–111)
Creatinine, Ser: 0.75 mg/dL (ref 0.44–1.00)
GFR, Estimated: >60 mL/min (ref >60)
Glucose, Bld: 107 mg/dL — ABNORMAL HIGH (ref 70–99)
Potassium: 4.1 mmol/L (ref 3.5–5.1)
Sodium: 137 mmol/L (ref 135–145)
Total Bilirubin: 0.6 mg/dL (ref 0.0–1.2)
Total Protein: 7.2 g/dL (ref 6.5–8.1)

## 2023-04-18 LAB — HCG, SERUM, QUALITATIVE: Preg, Serum: NEGATIVE

## 2023-04-18 LAB — LIPASE, BLOOD: Lipase: 27 U/L (ref 11–51)

## 2023-04-18 MED ORDER — IOHEXOL 350 MG/ML SOLN
75.0000 mL | Freq: Once | INTRAVENOUS | Status: AC | PRN
  Administered 2023-04-18: 75 mL via INTRAVENOUS

## 2023-04-18 NOTE — ED Provider Notes (Signed)
 [place] EMERGENCY DEPARTMENT AT [facility] Provider Note   CSN: [id number] Arrival date & time: 04/18/23  1556     History {Add pertinent medical, surgical, social history, OB history to HPI:1} Chief Complaint  Patient presents with   Abdominal Pain    Gabriela Jones is a 28 y.o. female who presents with concern for left sided abdominal pain that she woke with today, sharp in nature, persistent. Worse when sitting still, no other aggravating or alleviating factors identified. No NVD, no known fevers, + chills. No known ill contact. No urinary symptoms. No vaginal bleeding or discharge. Inconsistent periods on nexplanon. No hx of same. LMP "sometime in March".   HPI     Home Medications Prior to Admission medications   Medication Sig Start Date End Date Taking? Authorizing Provider  diclofenac (VOLTAREN) 50 MG EC tablet Take 1 tablet (50 mg total) by mouth 2 (two) times daily. 03/13/21   Raspet, Noberto Retort, PA-C  meclizine (ANTIVERT) 25 MG tablet Take 1 tablet (25 mg total) by mouth 3 (three) times daily as needed for dizziness. 06/15/21   Jacalyn Lefevre, MD  mupirocin ointment (BACTROBAN) 2 % Apply 1 Application topically 2 (two) times daily. 12/16/21   Jeannie Fend, PA-C  naproxen (NAPROSYN) 375 MG tablet Take 1 tablet (375 mg total) by mouth 2 (two) times daily. 06/30/22   Linwood Dibbles, MD  ondansetron (ZOFRAN-ODT) 4 MG disintegrating tablet Take 1 tablet (4 mg total) by mouth every 8 (eight) hours as needed for nausea or vomiting. 06/15/21   Jacalyn Lefevre, MD  SUMAtriptan (IMITREX) 50 MG tablet Take 1 tablet (50 mg total) by mouth every 2 (two) hours as needed for migraine. May repeat in 2 hours if headache persists or recurs. 06/30/22   Linwood Dibbles, MD      Allergies    Patient has no known allergies.    Review of Systems   Review of Systems  Constitutional:  Positive for chills.  Gastrointestinal:  Positive for abdominal pain.    Physical Exam Updated Vital  Signs BP 127/81 (BP Location: Left Arm)   Pulse 92   Temp 97.8 F (36.6 C) (Oral)   Resp 16   Ht 5\' 11"  (1.803 m)   Wt 104.3 kg   LMP 01/21/2023   SpO2 100%   BMI 32.08 kg/m  Physical Exam Vitals and nursing note reviewed.  Constitutional:      Appearance: She is not ill-appearing or toxic-appearing.  HENT:     Head: Normocephalic and atraumatic.     Mouth/Throat:     Mouth: Mucous membranes are moist.     Pharynx: No oropharyngeal exudate or posterior oropharyngeal erythema.  Eyes:     General:        Right eye: No discharge.        Left eye: No discharge.     Conjunctiva/sclera: Conjunctivae normal.  Cardiovascular:     Rate and Rhythm: Normal rate and regular rhythm.     Pulses: Normal pulses.  Pulmonary:     Effort: Pulmonary effort is normal. No respiratory distress.     Breath sounds: Normal breath sounds. No wheezing or rales.  Abdominal:     General: Bowel sounds are normal. There is no distension.     Palpations: Abdomen is soft.     Tenderness: There is abdominal tenderness in the left upper quadrant. There is no right CVA tenderness, left CVA tenderness, guarding or rebound.  Musculoskeletal:  General: No deformity.     Cervical back: Neck supple.  Skin:    General: Skin is warm and dry.     Capillary Refill: Capillary refill takes less than 2 seconds.  Neurological:     General: No focal deficit present.     Mental Status: She is alert and oriented to person, place, and time. Mental status is at baseline.  Psychiatric:        Mood and Affect: Mood normal.     ED Results / Procedures / Treatments   Labs (all labs ordered are listed, but only abnormal results are displayed) Labs Reviewed  COMPREHENSIVE METABOLIC PANEL WITH GFR - Abnormal; Notable for the following components:      Result Value   Glucose, Bld 107 (*)    BUN 5 (*)    AST 12 (*)    Alkaline Phosphatase 37 (*)    All other components within normal limits  URINALYSIS, ROUTINE W  REFLEX MICROSCOPIC - Abnormal; Notable for the following components:   APPearance HAZY (*)    Ketones, ur 5 (*)    Leukocytes,Ua MODERATE (*)    Bacteria, UA RARE (*)    All other components within normal limits  LIPASE, BLOOD  CBC  HCG, SERUM, QUALITATIVE    EKG None  Radiology No results found.  Procedures Procedures  {Document cardiac monitor, telemetry assessment procedure when appropriate:1}  Medications Ordered in ED Medications - No data to display  ED Course/ Medical Decision Making/ A&P   {   Click here for ABCD2, HEART and other calculatorsREFRESH Note before signing :1}                              Medical Decision Making 28 y/o female who presents with concern for abdominal pain.   VS normal on intake, cardiopulmonary exam is unremarkable, abdominal exam as above. Well-appearing.   The differential diagnosis for LUQ includes but is not limited to:  Cholelithiasis / choledocholithiasis / cholecystitis / cholangitis, hepatitis (eg. viral, alcoholic, toxic),liver abscess, pancreatitis, liver / pancreatic / biliary tract cancer, ischemic hepatopathy (shock liver), hepatic vein obstruction (Budd-Chiari syndrome), liver cell adenoma, peptic ulcer disease (duodenal), functional or nonulcer dyspepsia, right lower lobe pneumonia, pyelonephritis, urinary calculi, splenic infarct, herpes zoster, trauma or musculoskeletal pain, herniated disk, abdominal abscess, intestinal ischemia, physical or sexual abuse, ectopic pregnancy, IUP, Mittelschmerz, ovarian cyst/torsion, threatened/ievitable abortion, PID, endometriosis, molar pregnancy, heterotopic pregnancy, corpus luteum cyst, appendicitis, UTI/renal colic, IBD.    Amount and/or Complexity of Data Reviewed Labs: ordered.    Details: CBC, CMP, lipase. UA with leuks, WBC, equivocal for infection.  Radiology: ordered.  Risk Prescription drug management.   ***  {Document critical care time when appropriate:1} {Document  review of labs and clinical decision tools ie heart score, Chads2Vasc2 etc:1}  {Document your independent review of radiology images, and any outside records:1} {Document your discussion with family members, caretakers, and with consultants:1} {Document social determinants of health affecting pt's care:1} {Document your decision making why or why not admission, treatments were needed:1} Final Clinical Impression(s) / ED Diagnoses Final diagnoses:  None    Rx / DC Orders ED Discharge Orders     None

## 2023-04-18 NOTE — ED Triage Notes (Signed)
 Pt here with abd pain since this morning. Pain described as dull constant. Denies NVD. Pain is RLQ and LLQ. Denies urinary symptoms. Pain worse when standing and walking. LBM today and normal.Pt having chills but hadnt checked temp

## 2023-04-18 NOTE — ED Notes (Signed)
 Patient transported to CT

## 2023-04-18 NOTE — ED Provider Triage Note (Signed)
 Emergency Medicine Provider Triage Evaluation Note  Gabriela Jones , a 28 y.o. female  was evaluated in triage.  Pt complains of abdominal pain. She reports waking up this morning with bilateral lower abdominal pain. She reports she is sexually active but denies concern for pregnancy as she has a Nexplanon. She states the pain did not improve after bowel movement. No history of similar symptoms. Denies concerns for STIs.  Review of Systems  Positive: As above Negative: As above  Physical Exam  BP 124/72 (BP Location: Right Arm)   Pulse 91   Temp 98.5 F (36.9 C)   Resp 18   Ht 5\' 11"  (1.803 m)   Wt 104.3 kg   LMP 01/21/2023   SpO2 100%   BMI 32.08 kg/m  Gen:   Awake, no distress   Resp:  Normal effort  MSK:   Moves extremities without difficulty  Other:  No RLQ or LLQ TTP. No other focal findings.  Medical Decision Making  Medically screening exam initiated at 6:14 PM.  Appropriate orders placed.  Gabriela Jones was informed that the remainder of the evaluation will be completed by another provider, this initial triage assessment does not replace that evaluation, and the importance of remaining in the ED until their evaluation is complete.   Smitty Knudsen, PA-C 04/18/23 [phone]

## 2023-04-19 ENCOUNTER — Emergency Department (HOSPITAL_COMMUNITY)

## 2023-04-19 DIAGNOSIS — R102 Pelvic and perineal pain: Secondary | ICD-10-CM | POA: Diagnosis not present

## 2023-04-19 DIAGNOSIS — N83202 Unspecified ovarian cyst, left side: Secondary | ICD-10-CM | POA: Diagnosis not present

## 2023-04-19 DIAGNOSIS — N854 Malposition of uterus: Secondary | ICD-10-CM | POA: Diagnosis not present

## 2023-04-19 MED ORDER — IBUPROFEN 400 MG PO TABS
600.0000 mg | ORAL_TABLET | Freq: Once | ORAL | Status: AC
  Administered 2023-04-19: 600 mg via ORAL
  Filled 2023-04-19: qty 1

## 2023-04-19 NOTE — ED Notes (Signed)
 Patient transported to Ultrasound

## 2023-04-19 NOTE — Discharge Instructions (Signed)
 You are seen in the ER today for your abdominal pain.  You have a bleeding cyst on your left ovary.  Please follow-up with the OB/GYN listed below for repeat ultrasound in 6 to 12 weeks.  You may take Tylenol and ibuprofen as needed, use heat packs and return to the ER with any severe symptoms.

## 2023-04-20 DIAGNOSIS — Z419 Encounter for procedure for purposes other than remedying health state, unspecified: Secondary | ICD-10-CM | POA: Diagnosis not present

## 2023-04-20 LAB — URINE CULTURE: Culture: <10000 — AB

## 2023-05-10 ENCOUNTER — Encounter: Payer: Self-pay | Admitting: Obstetrics & Gynecology

## 2023-05-10 ENCOUNTER — Ambulatory Visit: Admitting: Obstetrics & Gynecology

## 2023-05-10 VITALS — BP 101/67 | HR 88 | Ht 71.0 in | Wt 232.0 lb

## 2023-05-10 DIAGNOSIS — N83202 Unspecified ovarian cyst, left side: Secondary | ICD-10-CM

## 2023-05-10 DIAGNOSIS — Z01419 Encounter for gynecological examination (general) (routine) without abnormal findings: Secondary | ICD-10-CM | POA: Diagnosis not present

## 2023-05-10 DIAGNOSIS — Z1339 Encounter for screening examination for other mental health and behavioral disorders: Secondary | ICD-10-CM | POA: Diagnosis not present

## 2023-05-10 NOTE — Progress Notes (Signed)
 GYNECOLOGY CLINIC ANNUAL PREVENTATIVE CARE ENCOUNTER NOTE  Subjective:   Gabriela Jones is a 28 y.o. G57P1001 female here for a routine annual gynecologic exam.  Current complaints: she was seen in ED 4/10 for LLQ pain and hemorrhagic left ovarian cyst was seen. Pain has resolved.   Denies abnormal vaginal bleeding, discharge, pelvic pain, problems with intercourse or other gynecologic concerns. Nexplanon is due for replacement 06/2023 at [facility]   Gynecologic History Patient's last menstrual period was 04/19/2023 (exact date). Contraception: Nexplanon Last Pap: 02/2022. Results were: normal   Obstetric History OB History  Gravida Para Term Preterm AB Living  1 1 1   1   SAB IAB Ectopic Multiple Live Births     0 1    # Outcome Date GA Lbr Len/2nd Weight Sex Type Anes PTL Lv  1 Term 04/14/18 [redacted]w[redacted]d 08:59 / 00:23 7 lb 10.8 oz (3.481 kg) M Vag-Spont EPI  LIV     Birth Comments: WDL    Past Medical History:  Diagnosis Date   Medical history non-contributory    Vaginal Pap smear, abnormal     Past Surgical History:  Procedure Laterality Date   TONSILLECTOMY      Current Outpatient Medications on File Prior to Visit  Medication Sig Dispense Refill   diclofenac  (VOLTAREN ) 50 MG EC tablet Take 1 tablet (50 mg total) by mouth 2 (two) times daily. 20 tablet 0   meclizine  (ANTIVERT ) 25 MG tablet Take 1 tablet (25 mg total) by mouth 3 (three) times daily as needed for dizziness. 30 tablet 0   mupirocin  ointment (BACTROBAN ) 2 % Apply 1 Application topically 2 (two) times daily. 22 g 0   naproxen  (NAPROSYN ) 375 MG tablet Take 1 tablet (375 mg total) by mouth 2 (two) times daily. 20 tablet 0   ondansetron  (ZOFRAN -ODT) 4 MG disintegrating tablet Take 1 tablet (4 mg total) by mouth every 8 (eight) hours as needed for nausea or vomiting. 20 tablet 0   SUMAtriptan  (IMITREX ) 50 MG tablet Take 1 tablet (50 mg total) by mouth every 2 (two) hours as needed for migraine. May repeat in 2 hours  if headache persists or recurs. 10 tablet 0   No current facility-administered medications on file prior to visit.    No Known Allergies  Social History   Socioeconomic History   Marital status: Single    Spouse name: Not on file   Number of children: 1   Years of education: Not on file   Highest education level: Not on file  Occupational History   Not on file  Tobacco Use   Smoking status: Never    Passive exposure: Yes   Smokeless tobacco: Never  Vaping Use   Vaping status: Never Used  Substance and Sexual Activity   Alcohol use: No   Drug use: No   Sexual activity: Yes    Birth control/protection: Implant    Comment: [facility]on [facility] in [facility] at [facility]  Other Topics Concern   Not on file  Social History Narrative   Not on file   Social Drivers of Health   Financial Resource Strain: Not on File (04/27/2021)   Received from [employer], [employer] [place]    Financial Resource Strain: 0  Food Insecurity: Not at Risk (01/12/2022)   Received from [facility], [place]   Food Insecurity    Food: 1  Transportation Needs: Not at Risk (01/12/2022)   Received from [place], [facility]   Transportation Needs  Transportation: 1  Physical Activity: Not on File (04/27/2021)   Received from [place], [place]   Physical Activity    Physical Activity: 0  Stress: Not on File (04/27/2021)   Received from [facility], [place]   Stress    Stress: 0  Social Connections: Not on File (09/13/2022)   Received from [facility]   Social Connections    Connectedness: 0  Intimate Partner Violence: Not on file    Family History  Problem Relation Age of Onset   Hypertension Father     The following portions of the patient's history were reviewed and updated as appropriate: allergies, current medications, past family history, past medical history, past social history, past surgical history and problem list.  Review of Systems Pertinent items are noted in HPI.   Objective:  BP 101/67    Pulse 88   Ht 5\' 11"  (1.803 m)   Wt 232 lb (105.2 kg)   LMP 04/19/2023 (Exact Date)   BMI 32.36 kg/m  CONSTITUTIONAL: Well-developed, well-nourished female in no acute distress.  HENT:  Normocephalic, atraumatic, External right and left ear normal. Oropharynx is clear and moist EYES: Conjunctivae and EOM are normal. Pupils are equal, round, and reactive to light. No scleral icterus.  NECK: Normal range of motion, supple, no masses.  Normal thyroid.  SKIN: Skin is warm and dry. No rash noted. Not diaphoretic. No erythema. No pallor. NEUROLGIC: Alert and oriented to person, place, and time. Normal reflexes, muscle tone coordination. No cranial nerve deficit noted. PSYCHIATRIC: Normal mood and affect. Normal behavior. Normal judgment and thought content. CARDIOVASCULAR: Normal heart rate noted, regular rhythm RESPIRATORY: Effort and breath sounds normal, no problems with respiration noted. BREASTS: Symmetric in size. No masses, skin changes, nipple drainage, or lymphadenopathy. ABDOMEN: Soft, normal bowel sounds, no distention noted.  No tenderness, rebound or guarding.  PELVIC: deferred, not indicated MUSCULOSKELETAL: Normal range of motion. No tenderness.  No cyanosis, clubbing, or edema.   Narrative & Impression  CLINICAL DATA:  Initial evaluation for acute pelvic pain.   EXAM: TRANSABDOMINAL AND TRANSVAGINAL ULTRASOUND OF PELVIS   DOPPLER ULTRASOUND OF OVARIES   TECHNIQUE: Both transabdominal and transvaginal ultrasound examinations of the pelvis were performed. Transabdominal technique was performed for global imaging of the pelvis including uterus, ovaries, adnexal regions, and pelvic cul-de-sac.   It was necessary to proceed with endovaginal exam following the transabdominal exam to visualize the endometrium. Color and duplex Doppler ultrasound was utilized to evaluate blood flow to the ovaries.   COMPARISON:  CT from 04/18/2023.   FINDINGS: Uterus   Measurements: 9.7 x  5.5 x 6.1 cm = volume: 167.2 mL. Uterus is anteverted. No discrete fibroid or other myometrial abnormality.   Endometrium   Thickness: 3.6 mm.  No focal abnormality visualized.   Right ovary   Measurements: 2.4 x 1.2 x 2.0 cm = volume: 3.1 mL. Normal appearance/no adnexal mass.   Left ovary   Measurements: 6.8 x 3.4 x 4.1 cm = volume: 49.4 mL. Hypoechoic complex cyst measuring up to approximately 4.5 cm is seen within the left ovary. Single internal septation versus two adjacent cysts noted. Small amount of vascularity seen along this intervening soft tissue band/septation. No nodularity.   Pulsed Doppler evaluation of both ovaries demonstrates normal low-resistance arterial and venous waveforms.   Other findings   Small volume free fluid present within the pelvis.   IMPRESSION: 1. 4.5 cm complex and septated left ovarian cyst, indeterminate. Short interval follow-up ultrasound in 6-12 weeks is recommended for  further evaluation. 2. Small volume free fluid within the pelvis, nonspecific, but most commonly physiologic. 3. No evidence for torsion or other acute abnormality.     Electronically Signed   By: Virgia Griffins M.D.   On: 04/19/2023 02:15      Assessment:  Annual gynecologic examination  F/u for hemorrhagic ovarian cyst   Plan:   Orders Placed This Encounter  Procedures   US  PELVIC COMPLETE WITH TRANSVAGINAL    Standing Status:   Future    Expiration Date:   05/09/2024    Reason for Exam (SYMPTOM  OR DIAGNOSIS REQUIRED):   f/u ovarian cyst    Preferred imaging location?:   [facility]   .  Routine preventative health maintenance measures emphasized. Please refer to After Visit Summary for other counseling recommendations.    Onnie Bilis, MD Attending Obstetrician & Gynecologist Center for [employer], [facility]

## 2023-05-10 NOTE — Progress Notes (Addendum)
 28 y.o. New GYN presents for ER follow up of Cyst on left Ovary. Pt uses Nexplanon for [facility].  Last PAP 02/13/2022 NILM

## 2023-05-20 ENCOUNTER — Ambulatory Visit (HOSPITAL_COMMUNITY)
Admission: RE | Admit: 2023-05-20 | Discharge: 2023-05-20 | Disposition: A | Discharge status: Home / Self Care | Source: Ambulatory Visit | Attending: Obstetrics & Gynecology | Admitting: Obstetrics & Gynecology

## 2023-05-20 DIAGNOSIS — N83202 Unspecified ovarian cyst, left side: Secondary | ICD-10-CM | POA: Diagnosis not present

## 2023-05-20 DIAGNOSIS — Z419 Encounter for procedure for purposes other than remedying health state, unspecified: Secondary | ICD-10-CM | POA: Diagnosis not present

## 2023-05-25 ENCOUNTER — Other Ambulatory Visit: Payer: Self-pay

## 2023-05-25 ENCOUNTER — Emergency Department (HOSPITAL_COMMUNITY)
Admission: EM | Admit: 2023-05-25 | Discharge: 2023-05-25 | Disposition: A | Discharge status: Home / Self Care | Attending: Emergency Medicine | Admitting: Emergency Medicine

## 2023-05-25 ENCOUNTER — Encounter (HOSPITAL_COMMUNITY): Payer: Self-pay | Admitting: Emergency Medicine

## 2023-05-25 ENCOUNTER — Emergency Department (HOSPITAL_COMMUNITY)

## 2023-05-25 DIAGNOSIS — M25512 Pain in left shoulder: Secondary | ICD-10-CM | POA: Diagnosis not present

## 2023-05-25 MED ORDER — NAPROXEN 500 MG PO TABS: 500.0000 mg | ORAL_TABLET | Freq: Two times a day (BID) | ORAL | 0 refills | Status: AC

## 2023-05-25 NOTE — Discharge Instructions (Addendum)
 You were given a short prescription for anti-inflammatories, please take 1 tablet twice a day for the next 7 days.  The x-ray of your left shoulder did not show any acute findings.

## 2023-05-25 NOTE — ED Triage Notes (Signed)
 Pt woke up with left shoulder pain and stiffness. Denies known injury.

## 2023-05-25 NOTE — ED Provider Notes (Signed)
 [place] EMERGENCY DEPARTMENT AT [facility] Provider Note   CSN: [id number] Arrival date & time: 05/25/23  1145     History  Chief Complaint  Patient presents with   Shoulder Pain    Gabriela Jones is a 28 y.o. female.  28 year old female with no past medical history presents to the ED with chief complaint of left shoulder pain and stiffness.  Reports she likely "slept wrong ", she reports every time she tries to move her shoulder she hears a popping sensation.  Pain is reproducible with palpation along the posterior aspect of the shoulder.  She did try to take some Tylenol  with some improvement in her symptoms.  No exacerbating factors.  Denies any fall, fevers, other injuries.  The history is provided by the patient.  Shoulder Pain Associated symptoms: no fever        Home Medications Prior to Admission medications   Medication Sig Start Date End Date Taking? Authorizing Provider  naproxen  (NAPROSYN ) 500 MG tablet Take 1 tablet (500 mg total) by mouth 2 (two) times daily for 7 days. 05/25/23 06/01/23 Yes Keaja Reaume, PA-C  diclofenac  (VOLTAREN ) 50 MG EC tablet Take 1 tablet (50 mg total) by mouth 2 (two) times daily. 03/13/21   Raspet, Erin K, PA-C  meclizine  (ANTIVERT ) 25 MG tablet Take 1 tablet (25 mg total) by mouth 3 (three) times daily as needed for dizziness. 06/15/21   Sueellen Emery, MD  mupirocin  ointment (BACTROBAN ) 2 % Apply 1 Application topically 2 (two) times daily. 12/16/21   Darlis Eisenmenger, PA-C  ondansetron  (ZOFRAN -ODT) 4 MG disintegrating tablet Take 1 tablet (4 mg total) by mouth every 8 (eight) hours as needed for nausea or vomiting. 06/15/21   Sueellen Emery, MD  SUMAtriptan  (IMITREX ) 50 MG tablet Take 1 tablet (50 mg total) by mouth every 2 (two) hours as needed for migraine. May repeat in 2 hours if headache persists or recurs. 06/30/22   Trish Furl, MD      Allergies    Patient has no known allergies.    Review of Systems   Review of Systems   Constitutional:  Negative for fever.  Respiratory:  Negative for wheezing.   Musculoskeletal:  Positive for arthralgias.    Physical Exam Updated Vital Signs BP 115/72 (BP Location: Right Arm)   Pulse 82   Temp 98.5 F (36.9 C)   Resp 16   Ht 5\' 11"  (1.803 m)   Wt 104.3 kg   LMP 04/19/2023 (Exact Date)   SpO2 99%   BMI 32.08 kg/m  Physical Exam Vitals and nursing note reviewed.  Constitutional:      Appearance: Normal appearance.  HENT:     Head: Normocephalic and atraumatic.     Mouth/Throat:     Mouth: Mucous membranes are moist.  Eyes:     Pupils: Pupils are equal, round, and reactive to light.  Cardiovascular:     Rate and Rhythm: Normal rate.  Pulmonary:     Effort: Pulmonary effort is normal.  Abdominal:     General: Abdomen is flat.  Musculoskeletal:        General: Signs of injury present.     Left elbow: Deformity present. Decreased range of motion. Tenderness present.     Cervical back: Normal range of motion and neck supple.     Comments: Full range of motion of the left shoulder, pulses are equal and present, sensation is intact.  Skin:    General: Skin is warm  and dry.  Neurological:     Mental Status: She is alert and oriented to person, place, and time.     ED Results / Procedures / Treatments   Labs (all labs ordered are listed, but only abnormal results are displayed) Labs Reviewed - No data to display  EKG None  Radiology DG Shoulder Left Result Date: 05/25/2023 CLINICAL DATA:  Acute left shoulder pain without known injury. EXAM: LEFT SHOULDER - 2+ VIEW COMPARISON:  None Available. FINDINGS: There is no evidence of fracture or dislocation. There is no evidence of arthropathy or other focal bone abnormality. Soft tissues are unremarkable. IMPRESSION: Negative. Electronically Signed   By: Rosalene Colon M.D.   On: 05/25/2023 12:37    Procedures Procedures    Medications Ordered in ED Medications - No data to display  ED Course/  Medical Decision Making/ A&P                                 Medical Decision Making Amount and/or Complexity of Data Reviewed Radiology: ordered.  Risk Prescription drug management.    Present to the ED with a chief complaint of nontraumatic left shoulder pain, feeling like she slept wrong.  Has taken some over-the-counter medication with some improvement in her symptoms.  Vitals here are within normal limits, there is no chest pain, no shortness of breath, no trauma.  No prior history of IV drug use, has full range of motion of the left shoulder without any weakness.  Suspicion for MSK pathology.  Low suspicion for septic joint.   Xray of the shoulder showed no acute findings.    Portions of this note were generated with Scientist, clinical (histocompatibility and immunogenetics). Dictation errors may occur despite best attempts at proofreading.   Final Clinical Impression(s) / ED Diagnoses Final diagnoses:  Acute pain of left shoulder    Rx / DC Orders ED Discharge Orders          Ordered    naproxen  (NAPROSYN ) 500 MG tablet  2 times daily        05/25/23 1303              Tregan Read, PA-C 05/25/23 1321    Horton, Kristie M, DO 05/25/23 1541

## 2023-05-27 ENCOUNTER — Ambulatory Visit: Payer: Self-pay | Admitting: Obstetrics & Gynecology

## 2023-05-27 ENCOUNTER — Encounter: Payer: Self-pay | Admitting: Obstetrics & Gynecology

## 2023-06-20 DIAGNOSIS — Z419 Encounter for procedure for purposes other than remedying health state, unspecified: Secondary | ICD-10-CM | POA: Diagnosis not present

## 2023-07-03 DIAGNOSIS — Z3046 Encounter for surveillance of implantable subdermal contraceptive: Secondary | ICD-10-CM | POA: Diagnosis not present

## 2023-07-20 ENCOUNTER — Encounter (HOSPITAL_COMMUNITY): Payer: Self-pay | Admitting: *Deleted

## 2023-07-20 ENCOUNTER — Other Ambulatory Visit: Payer: Self-pay

## 2023-07-20 ENCOUNTER — Emergency Department (HOSPITAL_COMMUNITY)
Admission: EM | Admit: 2023-07-20 | Discharge: 2023-07-21 | Disposition: A | Discharge status: Home / Self Care | Attending: Emergency Medicine | Admitting: Emergency Medicine

## 2023-07-20 DIAGNOSIS — R42 Dizziness and giddiness: Secondary | ICD-10-CM | POA: Insufficient documentation

## 2023-07-20 DIAGNOSIS — Z419 Encounter for procedure for purposes other than remedying health state, unspecified: Secondary | ICD-10-CM | POA: Diagnosis not present

## 2023-07-20 LAB — CBC WITH DIFFERENTIAL/PLATELET
Abs Immature Granulocytes: 0 K/uL (ref 0.00–0.07)
Basophils Absolute: 0 K/uL (ref 0.0–0.1)
Basophils Relative: 1 %
Eosinophils Absolute: 0.1 K/uL (ref 0.0–0.5)
Eosinophils Relative: 2 %
HCT: 38.4 % (ref 36.0–46.0)
Hemoglobin: 12.3 g/dL (ref 12.0–15.0)
Immature Granulocytes: 0 %
Lymphocytes Relative: 32 %
Lymphs Abs: 2 K/uL (ref 0.7–4.0)
MCH: 27.8 pg (ref 26.0–34.0)
MCHC: 32 g/dL (ref 30.0–36.0)
MCV: 86.9 fL (ref 80.0–100.0)
Monocytes Absolute: 0.2 K/uL (ref 0.1–1.0)
Monocytes Relative: 4 %
Neutro Abs: 3.8 K/uL (ref 1.7–7.7)
Neutrophils Relative %: 61 %
Platelets: 253 K/uL (ref 150–400)
RBC: 4.42 MIL/uL (ref 3.87–5.11)
RDW: 15 % (ref 11.5–15.5)
WBC: 6.2 K/uL (ref 4.0–10.5)
nRBC: 0 % (ref 0.0–0.2)

## 2023-07-20 MED ORDER — SODIUM CHLORIDE 0.9 % IV BOLUS
1000.0000 mL | Freq: Once | INTRAVENOUS | Status: AC
  Administered 2023-07-20: 1000 mL via INTRAVENOUS

## 2023-07-20 MED ORDER — ACETAMINOPHEN 500 MG PO TABS
1000.0000 mg | ORAL_TABLET | Freq: Once | ORAL | Status: AC
  Administered 2023-07-20: 1000 mg via ORAL
  Filled 2023-07-20: qty 2

## 2023-07-20 NOTE — ED Provider Notes (Signed)
 [place] EMERGENCY DEPARTMENT AT [facility] Provider Note   CSN: [id number] Arrival date & time: 07/20/23  1842     Patient presents with: Headache   Gabriela Jones is a 28 y.o. female.  {Add pertinent medical, surgical, social history, OB history to HPI:32947} 28 yo F with a chief complaint of dizziness.  She has trouble describing this.  Tells me it feels like she has to squint at times.  She has also had a headache off and on.  All started today.  Felt fine yesterday.  No known sick contacts.  Denies cough or congestion.  Denies any medications.  Denies recent diet change.   Headache      Prior to Admission medications   Medication Sig Start Date End Date Taking? Authorizing Provider  diclofenac  (VOLTAREN ) 50 MG EC tablet Take 1 tablet (50 mg total) by mouth 2 (two) times daily. 03/13/21   Raspet, Erin K, PA-C  meclizine  (ANTIVERT ) 25 MG tablet Take 1 tablet (25 mg total) by mouth 3 (three) times daily as needed for dizziness. 06/15/21   Dean Clarity, MD  mupirocin  ointment (BACTROBAN ) 2 % Apply 1 Application topically 2 (two) times daily. 12/16/21   Beverley Leita LABOR, PA-C  ondansetron  (ZOFRAN -ODT) 4 MG disintegrating tablet Take 1 tablet (4 mg total) by mouth every 8 (eight) hours as needed for nausea or vomiting. 06/15/21   Dean Clarity, MD  SUMAtriptan  (IMITREX ) 50 MG tablet Take 1 tablet (50 mg total) by mouth every 2 (two) hours as needed for migraine. May repeat in 2 hours if headache persists or recurs. 06/30/22   Randol Simmonds, MD    Allergies: Patient has no known allergies.    Review of Systems  Neurological:  Positive for headaches.    Updated Vital Signs BP 113/70 (BP Location: Left Arm)   Pulse 79   Temp 99.1 F (37.3 C)   Resp 16   Ht 5' 11 (1.803 m)   Wt 104.3 kg   LMP 07/20/2023   SpO2 100%   BMI 32.07 kg/m   Physical Exam Vitals and nursing note reviewed.  Constitutional:      General: She is not in acute distress.    Appearance: She  is well-developed. She is not diaphoretic.  HENT:     Head: Normocephalic and atraumatic.  Eyes:     Pupils: Pupils are equal, round, and reactive to light.  Cardiovascular:     Rate and Rhythm: Normal rate and regular rhythm.     Heart sounds: No murmur heard.    No friction rub. No gallop.  Pulmonary:     Effort: Pulmonary effort is normal.     Breath sounds: No wheezing or rales.  Abdominal:     General: There is no distension.     Palpations: Abdomen is soft.     Tenderness: There is no abdominal tenderness.  Musculoskeletal:        General: No tenderness.     Cervical back: Normal range of motion and neck supple.  Skin:    General: Skin is warm and dry.  Neurological:     Mental Status: She is alert and oriented to person, place, and time.     Cranial Nerves: Cranial nerves 2-12 are intact.     Sensory: Sensation is intact.     Motor: Motor function is intact.     Coordination: Coordination is intact.     Comments: Benign neuro exam  Psychiatric:  Behavior: Behavior normal.     (all labs ordered are listed, but only abnormal results are displayed) Labs Reviewed - No data to display  EKG: None  Radiology: No results found.  {Document cardiac monitor, telemetry assessment procedure when appropriate:32947} Procedures   Medications Ordered in the ED  sodium chloride  0.9 % bolus 1,000 mL (has no administration in time range)  acetaminophen  (TYLENOL ) tablet 1,000 mg (has no administration in time range)      {Click here for ABCD2, HEART and other calculators REFRESH Note before signing:1}                              Medical Decision Making Amount and/or Complexity of Data Reviewed Labs: ordered.  Risk OTC drugs.   28 yo F with a chief complaint of feeling dizzy.  She has a little trouble describing this and not sure exactly what she means by dizzy.  It sounds like maybe she is describing photophobia though has no obvious photophobia in the room on  exam.  She has benign neurologic exam here.  Will check blood work.  Bolus of IV fluids.  {Document critical care time when appropriate  Document review of labs and clinical decision tools ie CHADS2VASC2, etc  Document your independent review of radiology images and any outside records  Document your discussion with family members, caretakers and with consultants  Document social determinants of health affecting pt's care  Document your decision making why or why not admission, treatments were needed:32947:::1}   Final diagnoses:  None    ED Discharge Orders     None

## 2023-07-20 NOTE — ED Triage Notes (Signed)
 Headache with nausea all day  lmp bc

## 2023-07-21 LAB — BASIC METABOLIC PANEL WITH GFR
Anion gap: 11 (ref 5–15)
BUN: 6 mg/dL (ref 6–20)
CO2: 22 mmol/L (ref 22–32)
Calcium: 9.5 mg/dL (ref 8.9–10.3)
Chloride: 105 mmol/L (ref 98–111)
Creatinine, Ser: 0.81 mg/dL (ref 0.44–1.00)
GFR, Estimated: >60 mL/min (ref >60)
Glucose, Bld: 99 mg/dL (ref 70–99)
Potassium: 3.6 mmol/L (ref 3.5–5.1)
Sodium: 138 mmol/L (ref 135–145)

## 2023-07-21 LAB — HCG, SERUM, QUALITATIVE: Preg, Serum: NEGATIVE

## 2023-07-21 NOTE — Discharge Instructions (Signed)
Follow up with your family doc in the office.  

## 2024-01-12 ENCOUNTER — Encounter (HOSPITAL_COMMUNITY): Payer: Self-pay | Admitting: *Deleted

## 2024-01-12 ENCOUNTER — Ambulatory Visit (HOSPITAL_COMMUNITY)
Admission: EM | Admit: 2024-01-12 | Discharge: 2024-01-12 | Disposition: A | Discharge status: Home / Self Care | Attending: Emergency Medicine | Admitting: Emergency Medicine

## 2024-01-12 DIAGNOSIS — R21 Rash and other nonspecific skin eruption: Secondary | ICD-10-CM | POA: Diagnosis not present

## 2024-01-12 MED ORDER — CETIRIZINE HCL 10 MG PO TABS: 10.0000 mg | ORAL_TABLET | Freq: Every day | ORAL | 0 refills | Status: AC

## 2024-01-12 MED ORDER — TRIAMCINOLONE ACETONIDE 0.1 % EX CREA: 1.0000 | TOPICAL_CREAM | Freq: Two times a day (BID) | CUTANEOUS | 0 refills | Status: AC

## 2024-01-12 NOTE — ED Provider Notes (Signed)
 " [facility]    CSN: [id number] Arrival date & time: 01/12/24  1439      History   Chief Complaint Chief Complaint  Patient presents with   Rash    HPI Gabriela Jones is a 29 y.o. female.   Patient presents to clinic over concern of itchy bumps across the front of her thighs that started yesterday.  She has not tried medications or interventions prior to arrival.  Feels like the area is spreading to her face.  Does have a bump to her wrist and across her right breast.  Does not have a dog.  Did shower after the bumps popped up.  No one close to patient or in same household has similar rash.    The history is provided by the patient and medical records.  Rash   Past Medical History:  Diagnosis Date   Medical history non-contributory    Vaginal Pap smear, abnormal     Patient Active Problem List   Diagnosis Date Noted   Acne vulgaris 03/30/2016    Past Surgical History:  Procedure Laterality Date   TONSILLECTOMY      OB History     Gravida  1   Para  1   Term  1   Preterm      AB      Living  1      SAB      IAB      Ectopic      Multiple  0   Live Births  1            Home Medications    Prior to Admission medications  Medication Sig Start Date End Date Taking? Authorizing Provider  cetirizine  (ZYRTEC ) 10 MG tablet Take 1 tablet (10 mg total) by mouth daily. 01/12/24  Yes Zenas Santa  N, FNP  triamcinolone  cream (KENALOG ) 0.1 % Apply 1 Application topically 2 (two) times daily. 01/12/24  Yes Jearld Hemp  N, FNP  diclofenac  (VOLTAREN ) 50 MG EC tablet Take 1 tablet (50 mg total) by mouth 2 (two) times daily. 03/13/21   Raspet, Erin K, PA-C  meclizine  (ANTIVERT ) 25 MG tablet Take 1 tablet (25 mg total) by mouth 3 (three) times daily as needed for dizziness. 06/15/21   Dean Clarity, MD  mupirocin  ointment (BACTROBAN ) 2 % Apply 1 Application topically 2 (two) times daily. 12/16/21   Beverley Leita LABOR, PA-C  ondansetron   (ZOFRAN -ODT) 4 MG disintegrating tablet Take 1 tablet (4 mg total) by mouth every 8 (eight) hours as needed for nausea or vomiting. 06/15/21   Dean Clarity, MD  SUMAtriptan  (IMITREX ) 50 MG tablet Take 1 tablet (50 mg total) by mouth every 2 (two) hours as needed for migraine. May repeat in 2 hours if headache persists or recurs. 06/30/22   Randol Simmonds, MD    Family History Family History  Problem Relation Age of Onset   Hypertension Father     Social History Social History[1]   Allergies   Patient has no known allergies.   Review of Systems Review of Systems  Per HPI  Physical Exam Triage Vital Signs ED Triage Vitals  Encounter Vitals Group     BP 01/12/24 1550 111/70     Girls Systolic BP Percentile --      Girls Diastolic BP Percentile --      Boys Systolic BP Percentile --      Boys Diastolic BP Percentile --      Pulse Rate 01/12/24 1550  88     Resp 01/12/24 1550 16     Temp 01/12/24 1550 98.6 F (37 C)     Temp Source 01/12/24 1550 Oral     SpO2 01/12/24 1550 95 %     Weight --      Height --      Head Circumference --      Peak Flow --      Pain Score 01/12/24 1549 0     Pain Loc --      Pain Education --      Exclude from Growth Chart --    No data found.  Updated Vital Signs BP 111/70 (BP Location: Left Arm)   Pulse 88   Temp 98.6 F (37 C) (Oral)   Resp 16   SpO2 95%   Visual Acuity Right Eye Distance:   Left Eye Distance:   Bilateral Distance:    Right Eye Near:   Left Eye Near:    Bilateral Near:     Physical Exam Vitals and nursing note reviewed.  Constitutional:      Appearance: Normal appearance.  HENT:     Head: Normocephalic and atraumatic.     Right Ear: External ear normal.     Left Ear: External ear normal.     Nose: Nose normal.     Mouth/Throat:     Mouth: Mucous membranes are moist.  Eyes:     Conjunctiva/sclera: Conjunctivae normal.  Cardiovascular:     Rate and Rhythm: Normal rate.  Pulmonary:     Effort:  Pulmonary effort is normal. No respiratory distress.  Skin:    General: Skin is warm and dry.     Findings: Rash present. Rash is nodular and urticarial.     Comments: Nodular urticarial rash spread across anterior thighs.  1 nodular urticarial area to the right breast and the right wrist.  Appears to have the start of urticarial bumps to the left cheek.  Neurological:     General: No focal deficit present.     Mental Status: She is alert.  Psychiatric:        Mood and Affect: Mood normal.      UC Treatments / Results  Labs (all labs ordered are listed, but only abnormal results are displayed) Labs Reviewed - No data to display  EKG   Radiology No results found.  Procedures Procedures (including critical care time)  Medications Ordered in UC Medications - No data to display  Initial Impression / Assessment and Plan / UC Course  I have reviewed the triage vital signs and the nursing notes.  Pertinent labs & imaging results that were available during my care of the patient were reviewed by me and considered in my medical decision making (see chart for details).  Vitals and triage reviewed, patient is hemodynamically stable.  Urticarial nodular rash.  This case could be due to bug bites or environmental exposure.  Does not appear to be consistent with bedbugs, scabies or fleas.  Does not fit appearance of contact dermatitis.  Will trial topical steroid and antihistamine.  Encouraged laundering of all linens.  Plan of care, follow-up care return precautions given, no questions at this time.    Final Clinical Impressions(s) / UC Diagnoses   Final diagnoses:  Rash and nonspecific skin eruption     Discharge Instructions      Take the cetirizine  daily to help with itching and inflammation.  Apply the topical Kenalog  ointment up to 2 times daily  for the next week or so to help with itching and inflammation.  Please go home and shower in hot water.  Wash all clothes, bed  linen and towels in hot water as well to kill what ever you came in contact with.  Symptoms should improve with interventions.  If no improvement or any changes seek follow-up care.     ED Prescriptions     Medication Sig Dispense Auth. Provider   cetirizine  (ZYRTEC ) 10 MG tablet Take 1 tablet (10 mg total) by mouth daily. 30 tablet Dreama, Lakashia Collison  N, FNP   triamcinolone  cream (KENALOG ) 0.1 % Apply 1 Application topically 2 (two) times daily. 30 g Dreama, Joselyne Spake  N, FNP      PDMP not reviewed this encounter.     [1]  Social History Tobacco Use   Smoking status: Never    Passive exposure: Yes   Smokeless tobacco: Never  Vaping Use   Vaping status: Never Used  Substance Use Topics   Alcohol use: No   Drug use: No     Dreama Olean Sangster  N, FNP 01/12/24 1644  "

## 2024-01-12 NOTE — Discharge Instructions (Signed)
 Take the cetirizine  daily to help with itching and inflammation.  Apply the topical Kenalog  ointment up to 2 times daily for the next week or so to help with itching and inflammation.  Please go home and shower in hot water.  Wash all clothes, bed linen and towels in hot water as well to kill what ever you came in contact with.  Symptoms should improve with interventions.  If no improvement or any changes seek follow-up care.

## 2024-01-12 NOTE — ED Triage Notes (Signed)
 Pt states she has a rash on the front of both of her legs since yesterday. She has not put anything on that area.

## 2024-01-15 ENCOUNTER — Encounter (HOSPITAL_COMMUNITY): Payer: Self-pay

## 2024-01-15 ENCOUNTER — Other Ambulatory Visit: Payer: Self-pay

## 2024-01-15 ENCOUNTER — Emergency Department (HOSPITAL_COMMUNITY)
Admission: EM | Admit: 2024-01-15 | Discharge: 2024-01-15 | Disposition: A | Discharge status: Home / Self Care | Attending: Emergency Medicine | Admitting: Emergency Medicine

## 2024-01-15 DIAGNOSIS — R0981 Nasal congestion: Secondary | ICD-10-CM | POA: Diagnosis present

## 2024-01-15 DIAGNOSIS — R21 Rash and other nonspecific skin eruption: Secondary | ICD-10-CM | POA: Insufficient documentation

## 2024-01-15 DIAGNOSIS — J101 Influenza due to other identified influenza virus with other respiratory manifestations: Secondary | ICD-10-CM | POA: Diagnosis not present

## 2024-01-15 LAB — RESP PANEL BY RT-PCR (RSV, FLU A&B, COVID)  RVPGX2
Influenza A by PCR: POSITIVE — AB
Influenza B by PCR: NEGATIVE
Resp Syncytial Virus by PCR: NEGATIVE
SARS Coronavirus 2 by RT PCR: NEGATIVE

## 2024-01-15 MED ORDER — ACETAMINOPHEN 325 MG PO TABS
650.0000 mg | ORAL_TABLET | Freq: Once | ORAL | Status: AC | PRN
  Administered 2024-01-15: 650 mg via ORAL
  Filled 2024-01-15: qty 2

## 2024-01-15 MED ORDER — OSELTAMIVIR PHOSPHATE 75 MG PO CAPS: 75.0000 mg | ORAL_CAPSULE | Freq: Two times a day (BID) | ORAL | 0 refills | Status: AC

## 2024-01-15 NOTE — ED Triage Notes (Addendum)
 Pt came in via POV d/t flu-like s/s since yesterday, mainly c/o runny nose & chills. A/Ox4, denies any acute pain during triage, does have a temp of 101.9 upon arrival, denies any SOB/CP.

## 2024-01-15 NOTE — Discharge Instructions (Signed)
 You were seen for your influenza infection in the emergency department.   At home, please use Tylenol  and ibuprofen  for your muscle aches and fevers.  Please use over-the-counter cough medication or tea with honey for your cough. Take the tamiflu   Follow-up with your primary doctor in 2-3 days regarding your visit.  This may be over the phone.  Return immediately to the emergency department if you experience any of the following: Difficulty breathing, or any other concerning symptoms.    Thank you for visiting our Emergency Department. It was a pleasure taking care of you today.

## 2024-01-15 NOTE — ED Provider Notes (Signed)
 " [place] EMERGENCY DEPARTMENT AT [facility] Provider Note   CSN: [id number] Arrival date & time: 01/15/24  0745     Patient presents with: No chief complaint on file.   Gabriela Jones is a 29 y.o. female.   29 year old female with no relevant past medical history presents emergency department with runny nose, congestion, nonproductive cough, and chills since yesterday.  Also has had several days of welts on her left thigh but was seen in urgent care for this.  Reports that the rash is improving.       Prior to Admission medications  Medication Sig Start Date End Date Taking? Authorizing Provider  oseltamivir  (TAMIFLU ) 75 MG capsule Take 1 capsule (75 mg total) by mouth every 12 (twelve) hours. 01/15/24  Yes Yolande Lamar BROCKS, MD  cetirizine  (ZYRTEC ) 10 MG tablet Take 1 tablet (10 mg total) by mouth daily. 01/12/24   Dreama, [place]  N, FNP  diclofenac  (VOLTAREN ) 50 MG EC tablet Take 1 tablet (50 mg total) by mouth 2 (two) times daily. 03/13/21   Raspet, Erin K, PA-C  meclizine  (ANTIVERT ) 25 MG tablet Take 1 tablet (25 mg total) by mouth 3 (three) times daily as needed for dizziness. 06/15/21   Dean Clarity, MD  mupirocin  ointment (BACTROBAN ) 2 % Apply 1 Application topically 2 (two) times daily. 12/16/21   Beverley Leita LABOR, PA-C  ondansetron  (ZOFRAN -ODT) 4 MG disintegrating tablet Take 1 tablet (4 mg total) by mouth every 8 (eight) hours as needed for nausea or vomiting. 06/15/21   Dean Clarity, MD  SUMAtriptan  (IMITREX ) 50 MG tablet Take 1 tablet (50 mg total) by mouth every 2 (two) hours as needed for migraine. May repeat in 2 hours if headache persists or recurs. 06/30/22   Randol Simmonds, MD  triamcinolone  cream (KENALOG ) 0.1 % Apply 1 Application topically 2 (two) times daily. 01/12/24   Dreama, [place]  N, FNP    Allergies: Patient has no known allergies.    Review of Systems  Updated Vital Signs BP 108/72 (BP Location: Left Arm)   Pulse 99   Temp 99.2 F (37.3 C)    Resp 19   Ht 5' 11 (1.803 m)   Wt 99.8 kg   SpO2 95%   BMI 30.68 kg/m   Physical Exam Constitutional:      General: She is not in acute distress.    Appearance: Normal appearance. She is not ill-appearing.  HENT:     Nose: Congestion present.     Mouth/Throat:     Mouth: Mucous membranes are moist.     Pharynx: Oropharynx is clear.  Cardiovascular:     Rate and Rhythm: Normal rate and regular rhythm.     Pulses: Normal pulses.     Heart sounds: Normal heart sounds.  Pulmonary:     Effort: Pulmonary effort is normal.     Breath sounds: Normal breath sounds.  Skin:    Comments: Papular rash on left thigh the patient reports is improving  Neurological:     Mental Status: She is alert.     (all labs ordered are listed, but only abnormal results are displayed) Labs Reviewed  RESP PANEL BY RT-PCR (RSV, FLU A&B, COVID)  RVPGX2 - Abnormal; Notable for the following components:      Result Value   Influenza A by PCR POSITIVE (*)    All other components within normal limits    EKG: None  Radiology: No results found.   Procedures   Medications Ordered in  the ED  acetaminophen  (TYLENOL ) tablet 650 mg (650 mg Oral Given 01/15/24 0813)                                    Medical Decision Making Risk OTC drugs. Prescription drug management.   29 year old female who presents to the emergency department with URI type symptoms  Initial Ddx:  URI, pneumonia, cellulitis, allergic reaction  MDM/Course:  Patient presents emergency department with URI type symptoms.  Does have a rash on her leg that was present several days ago as well but appears to be improving.  Says that her symptoms started yesterday.  On exam vital signs are reassuring.  She is overall well-appearing.  Does have some congestion.  No abnormal lung sounds to suggest pneumonia.  Flu test is positive which I suspect is causing her symptoms.  Given the duration of her symptoms is a candidate for Tamiflu   which she is requesting at this point in time.  Did counsel her on how it can often times to cause GI side effects and to watch out for these and stop taking the medication if they become bothersome   This patient presents to the ED for concern of complaints listed in HPI, this involves an extensive number of treatment options, and is a complaint that carries with it a high risk of complications and morbidity. Disposition including potential need for admission considered.   Dispo: DC Home. Return precautions discussed including, but not limited to, those listed in the AVS. Allowed pt time to ask questions which were answered fully prior to dc.  I have reviewed the patients home medications and made adjustments as needed Records reviewed Outpatient Clinic Notes  Portions of this note were generated with Dragon dictation software. Dictation errors may occur despite best attempts at proofreading.     Final diagnoses:  Influenza A    ED Discharge Orders          Ordered    oseltamivir  (TAMIFLU ) 75 MG capsule  Every 12 hours        01/15/24 1039               Yolande Lamar BROCKS, MD 01/15/24 2009  "
